# Patient Record
Sex: Female | Born: 1955 | Race: White | Hispanic: No | Smoking: Former smoker
Health system: Southern US, Community
[De-identification: ages and names within clinical notes are randomized; demographics above are authoritative.]

## PROBLEM LIST (undated history)

## (undated) DIAGNOSIS — Q250 Patent ductus arteriosus: Secondary | ICD-10-CM

## (undated) DIAGNOSIS — F32A Depression, unspecified: Secondary | ICD-10-CM

## (undated) DIAGNOSIS — G4730 Sleep apnea, unspecified: Secondary | ICD-10-CM

## (undated) DIAGNOSIS — C801 Malignant (primary) neoplasm, unspecified: Secondary | ICD-10-CM

## (undated) DIAGNOSIS — I10 Essential (primary) hypertension: Secondary | ICD-10-CM

## (undated) DIAGNOSIS — J189 Pneumonia, unspecified organism: Secondary | ICD-10-CM

## (undated) DIAGNOSIS — M797 Fibromyalgia: Secondary | ICD-10-CM

## (undated) DIAGNOSIS — M1990 Unspecified osteoarthritis, unspecified site: Secondary | ICD-10-CM

## (undated) HISTORY — PX: WISDOM TOOTH EXTRACTION: SHX21

## (undated) HISTORY — PX: COLONOSCOPY: SHX174

## (undated) HISTORY — PX: OTHER SURGICAL HISTORY: SHX169

## (undated) HISTORY — PX: DILATION AND CURETTAGE OF UTERUS: SHX78

---

## 2004-06-26 ENCOUNTER — Ambulatory Visit: Payer: Self-pay | Admitting: Internal Medicine

## 2004-10-07 ENCOUNTER — Ambulatory Visit: Payer: Self-pay | Admitting: Gastroenterology

## 2005-10-08 ENCOUNTER — Ambulatory Visit: Payer: Self-pay | Admitting: Internal Medicine

## 2006-12-09 ENCOUNTER — Ambulatory Visit: Payer: Self-pay | Admitting: Internal Medicine

## 2007-06-07 ENCOUNTER — Ambulatory Visit: Payer: Self-pay | Admitting: Gastroenterology

## 2008-12-26 ENCOUNTER — Ambulatory Visit: Payer: Self-pay | Admitting: Family Medicine

## 2010-01-06 ENCOUNTER — Ambulatory Visit: Payer: Self-pay | Admitting: Family Medicine

## 2011-01-28 ENCOUNTER — Ambulatory Visit: Payer: Self-pay | Admitting: Family Medicine

## 2013-08-14 ENCOUNTER — Ambulatory Visit: Payer: Self-pay | Admitting: Family Medicine

## 2014-03-12 ENCOUNTER — Emergency Department: Payer: Self-pay | Admitting: Emergency Medicine

## 2014-08-27 ENCOUNTER — Emergency Department: Payer: Self-pay | Admitting: Emergency Medicine

## 2014-08-27 LAB — COMPREHENSIVE METABOLIC PANEL
Albumin: 3.6 g/dL (ref 3.4–5.0)
Alkaline Phosphatase: 79 U/L
Anion Gap: 7 (ref 7–16)
BUN: 17 mg/dL (ref 7–18)
Bilirubin,Total: 0.3 mg/dL (ref 0.2–1.0)
CALCIUM: 8.6 mg/dL (ref 8.5–10.1)
CHLORIDE: 107 mmol/L (ref 98–107)
CREATININE: 1.21 mg/dL (ref 0.60–1.30)
Co2: 27 mmol/L (ref 21–32)
EGFR (African American): 58 — ABNORMAL LOW
EGFR (Non-African Amer.): 48 — ABNORMAL LOW
Glucose: 83 mg/dL (ref 65–99)
Osmolality: 282 (ref 275–301)
Potassium: 3.4 mmol/L — ABNORMAL LOW (ref 3.5–5.1)
SGOT(AST): 27 U/L (ref 15–37)
SGPT (ALT): 26 U/L
Sodium: 141 mmol/L (ref 136–145)
Total Protein: 7.3 g/dL (ref 6.4–8.2)

## 2014-08-27 LAB — CBC
HCT: 41.3 % (ref 35.0–47.0)
HGB: 13.8 g/dL (ref 12.0–16.0)
MCH: 29.3 pg (ref 26.0–34.0)
MCHC: 33.4 g/dL (ref 32.0–36.0)
MCV: 88 fL (ref 80–100)
PLATELETS: 243 10*3/uL (ref 150–440)
RBC: 4.72 10*6/uL (ref 3.80–5.20)
RDW: 13 % (ref 11.5–14.5)
WBC: 7.5 10*3/uL (ref 3.6–11.0)

## 2014-08-27 LAB — URINALYSIS, COMPLETE
Bilirubin,UR: NEGATIVE
Blood: NEGATIVE
Glucose,UR: NEGATIVE mg/dL (ref 0–75)
Hyaline Cast: 8
KETONE: NEGATIVE
Nitrite: NEGATIVE
PH: 5 (ref 4.5–8.0)
Protein: 100
RBC,UR: 11 /HPF (ref 0–5)
Specific Gravity: 1.029 (ref 1.003–1.030)
Squamous Epithelial: 11
WBC UR: 15 /HPF (ref 0–5)

## 2014-08-27 LAB — TROPONIN I: Troponin-I: 0.02 ng/mL

## 2015-06-21 ENCOUNTER — Other Ambulatory Visit: Payer: Self-pay | Admitting: Family Medicine

## 2015-06-21 DIAGNOSIS — Z1231 Encounter for screening mammogram for malignant neoplasm of breast: Secondary | ICD-10-CM

## 2015-07-01 ENCOUNTER — Ambulatory Visit: Payer: Self-pay | Attending: Family Medicine

## 2015-11-27 ENCOUNTER — Ambulatory Visit: Payer: Self-pay

## 2015-12-04 ENCOUNTER — Ambulatory Visit
Admission: RE | Admit: 2015-12-04 | Discharge: 2015-12-04 | Disposition: A | Discharge status: Home / Self Care | Payer: 59 | Source: Ambulatory Visit | Attending: Family Medicine | Admitting: Family Medicine

## 2015-12-04 DIAGNOSIS — Z1231 Encounter for screening mammogram for malignant neoplasm of breast: Secondary | ICD-10-CM | POA: Insufficient documentation

## 2017-02-16 ENCOUNTER — Other Ambulatory Visit: Payer: Self-pay | Admitting: Family Medicine

## 2017-02-16 DIAGNOSIS — Z1231 Encounter for screening mammogram for malignant neoplasm of breast: Secondary | ICD-10-CM

## 2017-03-04 ENCOUNTER — Ambulatory Visit
Admission: RE | Admit: 2017-03-04 | Discharge: 2017-03-04 | Disposition: A | Discharge status: Home / Self Care | Payer: 59 | Source: Ambulatory Visit | Attending: Family Medicine | Admitting: Family Medicine

## 2017-03-04 DIAGNOSIS — Z1231 Encounter for screening mammogram for malignant neoplasm of breast: Secondary | ICD-10-CM | POA: Diagnosis not present

## 2018-06-13 ENCOUNTER — Other Ambulatory Visit: Payer: Self-pay | Admitting: Family Medicine

## 2018-06-13 DIAGNOSIS — Z1231 Encounter for screening mammogram for malignant neoplasm of breast: Secondary | ICD-10-CM

## 2018-06-15 ENCOUNTER — Ambulatory Visit
Admission: RE | Admit: 2018-06-15 | Discharge: 2018-06-15 | Disposition: A | Discharge status: Home / Self Care | Payer: BLUE CROSS/BLUE SHIELD | Source: Ambulatory Visit | Attending: Family Medicine | Admitting: Family Medicine

## 2018-06-15 DIAGNOSIS — Z1231 Encounter for screening mammogram for malignant neoplasm of breast: Secondary | ICD-10-CM | POA: Diagnosis not present

## 2019-07-28 ENCOUNTER — Other Ambulatory Visit: Payer: Self-pay

## 2019-07-28 DIAGNOSIS — Z20822 Contact with and (suspected) exposure to covid-19: Secondary | ICD-10-CM

## 2019-07-31 LAB — NOVEL CORONAVIRUS, NAA: SARS-CoV-2, NAA: NOT DETECTED

## 2019-12-13 ENCOUNTER — Other Ambulatory Visit: Payer: Self-pay | Admitting: Family Medicine

## 2019-12-13 DIAGNOSIS — Z780 Asymptomatic menopausal state: Secondary | ICD-10-CM

## 2019-12-13 DIAGNOSIS — Z1231 Encounter for screening mammogram for malignant neoplasm of breast: Secondary | ICD-10-CM

## 2020-01-18 ENCOUNTER — Other Ambulatory Visit: Payer: Self-pay | Admitting: Student

## 2020-01-18 ENCOUNTER — Ambulatory Visit
Admission: RE | Admit: 2020-01-18 | Discharge: 2020-01-18 | Disposition: A | Discharge status: Home / Self Care | Payer: Medicare Other | Source: Ambulatory Visit | Attending: Student | Admitting: Student

## 2020-01-18 ENCOUNTER — Other Ambulatory Visit: Payer: Self-pay

## 2020-01-18 DIAGNOSIS — M79601 Pain in right arm: Secondary | ICD-10-CM | POA: Insufficient documentation

## 2020-01-30 ENCOUNTER — Ambulatory Visit
Admission: RE | Admit: 2020-01-30 | Discharge: 2020-01-30 | Disposition: A | Discharge status: Home / Self Care | Payer: Medicare Other | Source: Ambulatory Visit | Attending: Family Medicine | Admitting: Family Medicine

## 2020-01-30 DIAGNOSIS — Z780 Asymptomatic menopausal state: Secondary | ICD-10-CM | POA: Diagnosis present

## 2020-01-30 DIAGNOSIS — Z1231 Encounter for screening mammogram for malignant neoplasm of breast: Secondary | ICD-10-CM | POA: Insufficient documentation

## 2020-12-25 ENCOUNTER — Other Ambulatory Visit: Payer: Self-pay | Admitting: Orthopedic Surgery

## 2020-12-25 DIAGNOSIS — M2392 Unspecified internal derangement of left knee: Secondary | ICD-10-CM

## 2020-12-25 DIAGNOSIS — M1712 Unilateral primary osteoarthritis, left knee: Secondary | ICD-10-CM

## 2020-12-25 DIAGNOSIS — G8929 Other chronic pain: Secondary | ICD-10-CM

## 2021-01-06 ENCOUNTER — Ambulatory Visit
Admission: RE | Admit: 2021-01-06 | Discharge: 2021-01-06 | Disposition: A | Discharge status: Home / Self Care | Payer: Medicare Other | Source: Ambulatory Visit | Attending: Orthopedic Surgery | Admitting: Orthopedic Surgery

## 2021-01-06 ENCOUNTER — Other Ambulatory Visit: Payer: Self-pay

## 2021-01-06 DIAGNOSIS — M1712 Unilateral primary osteoarthritis, left knee: Secondary | ICD-10-CM | POA: Diagnosis present

## 2021-01-06 DIAGNOSIS — G8929 Other chronic pain: Secondary | ICD-10-CM | POA: Diagnosis present

## 2021-01-06 DIAGNOSIS — M2392 Unspecified internal derangement of left knee: Secondary | ICD-10-CM | POA: Diagnosis present

## 2021-01-06 DIAGNOSIS — M25562 Pain in left knee: Secondary | ICD-10-CM | POA: Insufficient documentation

## 2021-03-19 ENCOUNTER — Other Ambulatory Visit: Payer: Self-pay | Admitting: Family Medicine

## 2021-03-19 DIAGNOSIS — Z1231 Encounter for screening mammogram for malignant neoplasm of breast: Secondary | ICD-10-CM

## 2021-03-25 ENCOUNTER — Other Ambulatory Visit: Payer: Self-pay | Admitting: Surgery

## 2021-04-08 ENCOUNTER — Other Ambulatory Visit: Payer: Self-pay

## 2021-04-08 ENCOUNTER — Encounter
Admission: RE | Admit: 2021-04-08 | Discharge: 2021-04-08 | Disposition: A | Discharge status: Home / Self Care | Payer: Medicare Other | Source: Ambulatory Visit | Attending: Surgery | Admitting: Surgery

## 2021-04-08 DIAGNOSIS — Z01818 Encounter for other preprocedural examination: Secondary | ICD-10-CM | POA: Insufficient documentation

## 2021-04-08 DIAGNOSIS — I10 Essential (primary) hypertension: Secondary | ICD-10-CM | POA: Insufficient documentation

## 2021-04-08 HISTORY — DX: Unspecified osteoarthritis, unspecified site: M19.90

## 2021-04-08 HISTORY — DX: Depression, unspecified: F32.A

## 2021-04-08 HISTORY — DX: Patent ductus arteriosus: Q25.0

## 2021-04-08 HISTORY — DX: Fibromyalgia: M79.7

## 2021-04-08 HISTORY — DX: Pneumonia, unspecified organism: J18.9

## 2021-04-08 HISTORY — DX: Malignant (primary) neoplasm, unspecified: C80.1

## 2021-04-08 HISTORY — DX: Sleep apnea, unspecified: G47.30

## 2021-04-08 HISTORY — DX: Essential (primary) hypertension: I10

## 2021-04-08 LAB — URINALYSIS, ROUTINE W REFLEX MICROSCOPIC
Bilirubin Urine: NEGATIVE
Glucose, UA: NEGATIVE mg/dL
Hgb urine dipstick: NEGATIVE
Ketones, ur: NEGATIVE mg/dL
Leukocytes,Ua: NEGATIVE
Nitrite: NEGATIVE
Protein, ur: NEGATIVE mg/dL
Specific Gravity, Urine: 1.016 (ref 1.005–1.030)
pH: 6 (ref 5.0–8.0)

## 2021-04-08 LAB — CBC WITH DIFFERENTIAL/PLATELET
Abs Immature Granulocytes: 0.03 10*3/uL (ref 0.00–0.07)
Basophils Absolute: 0 10*3/uL (ref 0.0–0.1)
Basophils Relative: 0 %
Eosinophils Absolute: 0.3 10*3/uL (ref 0.0–0.5)
Eosinophils Relative: 4 %
HCT: 42.4 % (ref 36.0–46.0)
Hemoglobin: 14.1 g/dL (ref 12.0–15.0)
Immature Granulocytes: 0 %
Lymphocytes Relative: 31 %
Lymphs Abs: 2.1 10*3/uL (ref 0.7–4.0)
MCH: 29 pg (ref 26.0–34.0)
MCHC: 33.3 g/dL (ref 30.0–36.0)
MCV: 87.2 fL (ref 80.0–100.0)
Monocytes Absolute: 0.6 10*3/uL (ref 0.1–1.0)
Monocytes Relative: 9 %
Neutro Abs: 3.7 10*3/uL (ref 1.7–7.7)
Neutrophils Relative %: 56 %
Platelets: 269 10*3/uL (ref 150–400)
RBC: 4.86 MIL/uL (ref 3.87–5.11)
RDW: 12.3 % (ref 11.5–15.5)
WBC: 6.7 10*3/uL (ref 4.0–10.5)
nRBC: 0 % (ref 0.0–0.2)

## 2021-04-08 LAB — COMPREHENSIVE METABOLIC PANEL
ALT: 17 U/L (ref 0–44)
AST: 17 U/L (ref 15–41)
Albumin: 4 g/dL (ref 3.5–5.0)
Alkaline Phosphatase: 72 U/L (ref 38–126)
Anion gap: 6 (ref 5–15)
BUN: 16 mg/dL (ref 8–23)
CO2: 27 mmol/L (ref 22–32)
Calcium: 9.3 mg/dL (ref 8.9–10.3)
Chloride: 106 mmol/L (ref 98–111)
Creatinine, Ser: 0.95 mg/dL (ref 0.44–1.00)
GFR, Estimated: >60 mL/min (ref >60)
Glucose, Bld: 88 mg/dL (ref 70–99)
Potassium: 3.8 mmol/L (ref 3.5–5.1)
Sodium: 139 mmol/L (ref 135–145)
Total Bilirubin: 0.7 mg/dL (ref 0.3–1.2)
Total Protein: 7.1 g/dL (ref 6.5–8.1)

## 2021-04-08 LAB — TYPE AND SCREEN
ABO/RH(D): A POS
Antibody Screen: NEGATIVE

## 2021-04-08 LAB — SURGICAL PCR SCREEN
MRSA, PCR: NEGATIVE
Staphylococcus aureus: NEGATIVE

## 2021-04-08 NOTE — Patient Instructions (Addendum)
Your procedure is scheduled on: 04/15/21 - Tuesday Report to the Registration Desk on the 1st floor of the [facility]. To find out your arrival time, please call [phone] between 1PM - 3PM on: 04/14/21 - Monday Report to [facility] for Covid Test on 04/11/21 - 8:25 am   REMEMBER: Instructions that are not followed completely may result in serious medical risk, up to and including death; or upon the discretion of your surgeon and anesthesiologist your surgery may need to be rescheduled.  Do not eat food after midnight the night before surgery.  No gum chewing, lozengers or hard candies.  You may however, drink CLEAR liquids up to 2 hours before you are scheduled to arrive for your surgery. Do not drink anything within 2 hours of your scheduled arrival time.  Clear liquids include: - water  - apple juice without pulp - gatorade (not RED, PURPLE, OR BLUE) - black coffee or tea (Do NOT add milk or creamers to the coffee or tea) Do NOT drink anything that is not on this list.  Type 1 and Type 2 diabetics should only drink water.  In addition, your doctor has ordered for you to drink the provided  Ensure Pre-Surgery Clear Carbohydrate Drink  Drinking this carbohydrate drink up to two hours before surgery helps to reduce insulin resistance and improve patient outcomes. Please complete drinking 2 hours prior to scheduled arrival time.  TAKE THESE MEDICATIONS THE MORNING OF SURGERY WITH A SIP OF WATER:  - buPROPion (WELLBUTRIN XL) 150 MG 24 hr tablet - celecoxib (CELEBREX) 200 MG capsule - oxybutynin (DITROPAN) 5 MG tablet  One week prior to surgery: Stop Anti-inflammatories (NSAIDS) such as Advil, Aleve, Ibuprofen, Motrin, Naproxen, Naprosyn and Aspirin based products such as Excedrin, Goodys Powder, BC Powder.  Stop ANY OVER THE COUNTER supplements until after surgery.  You may however, continue to take Tylenol if needed for pain up until the day of surgery.  No Alcohol for  24 hours before or after surgery.  No Smoking including e-cigarettes for 24 hours prior to surgery.  No chewable tobacco products for at least 6 hours prior to surgery.  No nicotine patches on the day of surgery.  Do not use any "recreational" drugs for at least a week prior to your surgery.  Please be advised that the combination of cocaine and anesthesia may have negative outcomes, up to and including death. If you test positive for cocaine, your surgery will be cancelled.  On the morning of surgery brush your teeth with toothpaste and water, you may rinse your mouth with mouthwash if you wish. Do not swallow any toothpaste or mouthwash.  Do not wear jewelry, make-up, hairpins, clips or nail polish.  Do not wear lotions, powders, or perfumes.   Do not shave body from the neck down 48 hours prior to surgery just in case you cut yourself which could leave a site for infection.  Also, freshly shaved skin may become irritated if using the CHG soap.  Contact lenses, hearing aids and dentures may not be worn into surgery.  Do not bring valuables to the hospital. [facility] is not responsible for any missing/lost belongings or valuables.   Use CHG Soap or wipes as directed on instruction sheet.  Notify your doctor if there is any change in your medical condition (cold, fever, infection).  Wear comfortable clothing (specific to your surgery type) to the hospital.  After surgery, you can help prevent lung complications by doing breathing exercises.  Take deep breaths and cough every 1-2 hours. Your doctor may order a device called an Incentive Spirometer to help you take deep breaths. When coughing or sneezing, hold a pillow firmly against your incision with both hands. This is called "splinting." Doing this helps protect your incision. It also decreases belly discomfort.  If you are being admitted to the hospital overnight, leave your suitcase in the car. After surgery it may be brought  to your room.  If you are being discharged the day of surgery, you will not be allowed to drive home. You will need a responsible adult (18 years or older) to drive you home and stay with you that night.   If you are taking public transportation, you will need to have a responsible adult (18 years or older) with you. Please confirm with your physician that it is acceptable to use public transportation.   Please call the [facility] at [phone] if you have any questions about these instructions.  Surgery Visitation Policy:  Patients undergoing a surgery or procedure may have one family member or support person with them as long as that person is not COVID-19 positive or experiencing its symptoms.  That person may remain in the waiting area during the procedure.  Inpatient Visitation:    Visiting hours are 7 a.m. to 8 p.m. Inpatients will be allowed two visitors daily. The visitors may change each day during the patient's stay. No visitors under the age of 12. Any visitor under the age of 35 must be accompanied by an adult. The visitor must pass COVID-19 screenings, use hand sanitizer when entering and exiting the patient's room and wear a mask at all times, including in the patient's room. Patients must also wear a mask when staff or their visitor are in the room. Masking is required regardless of vaccination status.

## 2021-04-10 ENCOUNTER — Ambulatory Visit
Admission: RE | Admit: 2021-04-10 | Discharge: 2021-04-10 | Disposition: A | Discharge status: Home / Self Care | Payer: Medicare Other | Source: Ambulatory Visit | Attending: Family Medicine | Admitting: Family Medicine

## 2021-04-10 ENCOUNTER — Other Ambulatory Visit: Payer: Self-pay

## 2021-04-10 DIAGNOSIS — Z1231 Encounter for screening mammogram for malignant neoplasm of breast: Secondary | ICD-10-CM | POA: Insufficient documentation

## 2021-04-11 ENCOUNTER — Other Ambulatory Visit
Admission: RE | Admit: 2021-04-11 | Discharge: 2021-04-11 | Disposition: A | Discharge status: Home / Self Care | Payer: Medicare Other | Source: Ambulatory Visit | Attending: Surgery | Admitting: Surgery

## 2021-04-11 DIAGNOSIS — Z01812 Encounter for preprocedural laboratory examination: Secondary | ICD-10-CM | POA: Diagnosis present

## 2021-04-11 DIAGNOSIS — Z20822 Contact with and (suspected) exposure to covid-19: Secondary | ICD-10-CM | POA: Diagnosis not present

## 2021-04-11 LAB — SARS CORONAVIRUS 2 (TAT 6-24 HRS): SARS Coronavirus 2: NEGATIVE

## 2021-04-15 ENCOUNTER — Encounter: Payer: Self-pay | Admitting: Surgery

## 2021-04-15 ENCOUNTER — Inpatient Hospital Stay: Payer: Medicare Other

## 2021-04-15 ENCOUNTER — Inpatient Hospital Stay: Payer: Medicare Other | Admitting: Certified Registered"

## 2021-04-15 ENCOUNTER — Observation Stay
Admission: RE | Admit: 2021-04-15 | Discharge: 2021-04-16 | Disposition: A | Discharge status: Home / Self Care | Payer: Medicare Other | Attending: Surgery | Admitting: Surgery

## 2021-04-15 ENCOUNTER — Encounter
Admission: RE | Disposition: A | Discharge status: Home / Self Care | Payer: Self-pay | Source: Home / Self Care | Attending: Surgery

## 2021-04-15 ENCOUNTER — Other Ambulatory Visit: Payer: Self-pay

## 2021-04-15 DIAGNOSIS — Z859 Personal history of malignant neoplasm, unspecified: Secondary | ICD-10-CM | POA: Insufficient documentation

## 2021-04-15 DIAGNOSIS — M1712 Unilateral primary osteoarthritis, left knee: Principal | ICD-10-CM | POA: Insufficient documentation

## 2021-04-15 DIAGNOSIS — Z96652 Presence of left artificial knee joint: Secondary | ICD-10-CM

## 2021-04-15 DIAGNOSIS — Z87891 Personal history of nicotine dependence: Secondary | ICD-10-CM | POA: Insufficient documentation

## 2021-04-15 DIAGNOSIS — Z79899 Other long term (current) drug therapy: Secondary | ICD-10-CM | POA: Insufficient documentation

## 2021-04-15 DIAGNOSIS — I10 Essential (primary) hypertension: Secondary | ICD-10-CM | POA: Diagnosis not present

## 2021-04-15 HISTORY — PX: TOTAL KNEE ARTHROPLASTY: SHX125

## 2021-04-15 LAB — ABO/RH: ABO/RH(D): A POS

## 2021-04-15 SURGERY — ARTHROPLASTY, KNEE, TOTAL
Anesthesia: General | Site: Knee | Laterality: Left

## 2021-04-15 MED ORDER — BUPIVACAINE LIPOSOME 1.3 % IJ SUSP
INTRAMUSCULAR | Status: AC
  Filled 2021-04-15: qty 20

## 2021-04-15 MED ORDER — FLEET ENEMA 7-19 GM/118ML RE ENEM: 1.0000 | ENEMA | Freq: Once | RECTAL | Status: DC | PRN

## 2021-04-15 MED ORDER — BUPIVACAINE-EPINEPHRINE (PF) 0.5% -1:200000 IJ SOLN
INTRAMUSCULAR | Status: DC | PRN
  Administered 2021-04-15: 30 mL

## 2021-04-15 MED ORDER — FENTANYL CITRATE (PF) 100 MCG/2ML IJ SOLN
25.0000 ug | INTRAMUSCULAR | Status: DC | PRN
  Administered 2021-04-15 (×2): 25 ug via INTRAVENOUS

## 2021-04-15 MED ORDER — FAMOTIDINE 20 MG PO TABS: 20.0000 mg | ORAL_TABLET | Freq: Once | ORAL | Status: AC

## 2021-04-15 MED ORDER — CHLORHEXIDINE GLUCONATE 0.12 % MT SOLN
OROMUCOSAL | Status: AC
  Administered 2021-04-15: 15 mL via OROMUCOSAL
  Filled 2021-04-15: qty 15

## 2021-04-15 MED ORDER — KETOROLAC TROMETHAMINE 15 MG/ML IJ SOLN
7.5000 mg | Freq: Four times a day (QID) | INTRAMUSCULAR | Status: AC
  Administered 2021-04-15 – 2021-04-16 (×4): 7.5 mg via INTRAVENOUS
  Filled 2021-04-15 (×4): qty 1

## 2021-04-15 MED ORDER — GLYCOPYRROLATE 0.2 MG/ML IJ SOLN
INTRAMUSCULAR | Status: DC | PRN
  Administered 2021-04-15: .2 mg via INTRAVENOUS

## 2021-04-15 MED ORDER — ACETAMINOPHEN 10 MG/ML IV SOLN
INTRAVENOUS | Status: AC
  Filled 2021-04-15: qty 100

## 2021-04-15 MED ORDER — PROPOFOL 500 MG/50ML IV EMUL
INTRAVENOUS | Status: DC | PRN
  Administered 2021-04-15: 100 ug/kg/min via INTRAVENOUS

## 2021-04-15 MED ORDER — LACTATED RINGERS IV SOLN: INTRAVENOUS | Status: DC

## 2021-04-15 MED ORDER — 0.9 % SODIUM CHLORIDE (POUR BTL) OPTIME
TOPICAL | Status: DC | PRN
  Administered 2021-04-15: 1000 mL

## 2021-04-15 MED ORDER — CEFAZOLIN SODIUM-DEXTROSE 2-4 GM/100ML-% IV SOLN
2.0000 g | Freq: Four times a day (QID) | INTRAVENOUS | Status: AC
  Administered 2021-04-15 – 2021-04-16 (×3): 2 g via INTRAVENOUS
  Filled 2021-04-15 (×3): qty 100

## 2021-04-15 MED ORDER — METOCLOPRAMIDE HCL 10 MG PO TABS: 5.0000 mg | ORAL_TABLET | Freq: Three times a day (TID) | ORAL | Status: DC | PRN

## 2021-04-15 MED ORDER — ONDANSETRON HCL 4 MG PO TABS: 4.0000 mg | ORAL_TABLET | Freq: Four times a day (QID) | ORAL | Status: DC | PRN

## 2021-04-15 MED ORDER — DOCUSATE SODIUM 100 MG PO CAPS
100.0000 mg | ORAL_CAPSULE | Freq: Two times a day (BID) | ORAL | Status: DC
  Administered 2021-04-15 – 2021-04-16 (×2): 100 mg via ORAL
  Filled 2021-04-15 (×2): qty 1

## 2021-04-15 MED ORDER — FENTANYL CITRATE (PF) 100 MCG/2ML IJ SOLN
INTRAMUSCULAR | Status: AC
  Filled 2021-04-15: qty 2

## 2021-04-15 MED ORDER — BUPIVACAINE-EPINEPHRINE (PF) 0.5% -1:200000 IJ SOLN
INTRAMUSCULAR | Status: AC
  Filled 2021-04-15: qty 30

## 2021-04-15 MED ORDER — TRAZODONE HCL 100 MG PO TABS
100.0000 mg | ORAL_TABLET | Freq: Every day | ORAL | Status: DC
  Administered 2021-04-15: 100 mg via ORAL
  Filled 2021-04-15: qty 1

## 2021-04-15 MED ORDER — FENTANYL CITRATE (PF) 100 MCG/2ML IJ SOLN
INTRAMUSCULAR | Status: DC | PRN
  Administered 2021-04-15: 50 ug via INTRAVENOUS
  Administered 2021-04-15: 25 ug via INTRAVENOUS

## 2021-04-15 MED ORDER — BUPIVACAINE HCL (PF) 0.5 % IJ SOLN
INTRAMUSCULAR | Status: DC | PRN
  Administered 2021-04-15: 3 mL

## 2021-04-15 MED ORDER — TRAMADOL HCL 50 MG PO TABS
50.0000 mg | ORAL_TABLET | Freq: Four times a day (QID) | ORAL | Status: DC | PRN
Start: 2021-04-15 — End: 2021-04-16

## 2021-04-15 MED ORDER — ACETAMINOPHEN 10 MG/ML IV SOLN
INTRAVENOUS | Status: DC | PRN
  Administered 2021-04-15: 1000 mg via INTRAVENOUS

## 2021-04-15 MED ORDER — BUPIVACAINE LIPOSOME 1.3 % IJ SUSP
INTRAMUSCULAR | Status: DC | PRN
  Administered 2021-04-15: 20 mL

## 2021-04-15 MED ORDER — AMLODIPINE BESYLATE 10 MG PO TABS
10.0000 mg | ORAL_TABLET | Freq: Every day | ORAL | Status: DC
  Administered 2021-04-15: 10 mg via ORAL
  Filled 2021-04-15: qty 1

## 2021-04-15 MED ORDER — ORAL CARE MOUTH RINSE
15.0000 mL | Freq: Once | OROMUCOSAL | Status: AC
Start: 2021-04-15 — End: 2021-04-15

## 2021-04-15 MED ORDER — ACETAMINOPHEN 500 MG PO TABS
1000.0000 mg | ORAL_TABLET | Freq: Four times a day (QID) | ORAL | Status: DC
  Administered 2021-04-15 – 2021-04-16 (×3): 1000 mg via ORAL
  Filled 2021-04-15 (×4): qty 2

## 2021-04-15 MED ORDER — MIDAZOLAM HCL 2 MG/2ML IJ SOLN
INTRAMUSCULAR | Status: AC
  Filled 2021-04-15: qty 2

## 2021-04-15 MED ORDER — ACETAMINOPHEN 325 MG PO TABS: 325.0000 mg | ORAL_TABLET | Freq: Four times a day (QID) | ORAL | Status: DC | PRN

## 2021-04-15 MED ORDER — SODIUM CHLORIDE (PF) 0.9 % IJ SOLN
INTRAMUSCULAR | Status: DC | PRN
  Administered 2021-04-15: 40 mL

## 2021-04-15 MED ORDER — FAMOTIDINE 20 MG PO TABS
ORAL_TABLET | ORAL | Status: AC
  Administered 2021-04-15: 20 mg via ORAL
  Filled 2021-04-15: qty 1

## 2021-04-15 MED ORDER — OXYBUTYNIN CHLORIDE 5 MG PO TABS
5.0000 mg | ORAL_TABLET | Freq: Every day | ORAL | Status: DC
  Administered 2021-04-16: 5 mg via ORAL
  Filled 2021-04-15: qty 1

## 2021-04-15 MED ORDER — MIDAZOLAM HCL 5 MG/5ML IJ SOLN
INTRAMUSCULAR | Status: DC | PRN
  Administered 2021-04-15: .5 mg via INTRAVENOUS
  Administered 2021-04-15: 1.5 mg via INTRAVENOUS

## 2021-04-15 MED ORDER — BISACODYL 10 MG RE SUPP: 10.0000 mg | Freq: Every day | RECTAL | Status: DC | PRN

## 2021-04-15 MED ORDER — HYDROMORPHONE HCL 1 MG/ML IJ SOLN
0.2500 mg | INTRAMUSCULAR | Status: DC | PRN
  Administered 2021-04-15: 0.5 mg via INTRAVENOUS
  Filled 2021-04-15: qty 1

## 2021-04-15 MED ORDER — CEFAZOLIN SODIUM-DEXTROSE 2-4 GM/100ML-% IV SOLN
2.0000 g | INTRAVENOUS | Status: AC
  Administered 2021-04-15: 2 g via INTRAVENOUS

## 2021-04-15 MED ORDER — CHLORHEXIDINE GLUCONATE 0.12 % MT SOLN: 15.0000 mL | Freq: Once | OROMUCOSAL | Status: AC

## 2021-04-15 MED ORDER — SODIUM CHLORIDE FLUSH 0.9 % IV SOLN
INTRAVENOUS | Status: AC
  Filled 2021-04-15: qty 40

## 2021-04-15 MED ORDER — CEFAZOLIN SODIUM-DEXTROSE 2-4 GM/100ML-% IV SOLN
INTRAVENOUS | Status: AC
  Filled 2021-04-15: qty 100

## 2021-04-15 MED ORDER — METOCLOPRAMIDE HCL 5 MG/ML IJ SOLN: 5.0000 mg | Freq: Three times a day (TID) | INTRAMUSCULAR | Status: DC | PRN

## 2021-04-15 MED ORDER — TRANEXAMIC ACID 1000 MG/10ML IV SOLN
INTRAVENOUS | Status: AC
  Filled 2021-04-15: qty 10

## 2021-04-15 MED ORDER — SODIUM CHLORIDE 0.9 % IV SOLN
INTRAVENOUS | Status: DC | PRN
  Administered 2021-04-15: 10 ug/min via INTRAVENOUS

## 2021-04-15 MED ORDER — ONDANSETRON HCL 4 MG/2ML IJ SOLN: 4.0000 mg | Freq: Four times a day (QID) | INTRAMUSCULAR | Status: DC | PRN

## 2021-04-15 MED ORDER — OXYCODONE HCL 5 MG PO TABS
5.0000 mg | ORAL_TABLET | ORAL | Status: DC | PRN
  Administered 2021-04-15 – 2021-04-16 (×3): 5 mg via ORAL
  Administered 2021-04-16: 10 mg via ORAL
  Administered 2021-04-16: 5 mg via ORAL
  Filled 2021-04-15 (×2): qty 1
  Filled 2021-04-15: qty 2
  Filled 2021-04-15: qty 1

## 2021-04-15 MED ORDER — MAGNESIUM HYDROXIDE 400 MG/5ML PO SUSP: 30.0000 mL | Freq: Every day | ORAL | Status: DC | PRN

## 2021-04-15 MED ORDER — ONDANSETRON HCL 4 MG/2ML IJ SOLN: 4.0000 mg | Freq: Once | INTRAMUSCULAR | Status: DC | PRN

## 2021-04-15 MED ORDER — ENALAPRIL MALEATE 5 MG PO TABS
5.0000 mg | ORAL_TABLET | Freq: Every day | ORAL | Status: DC
  Administered 2021-04-15: 5 mg via ORAL
  Filled 2021-04-15 (×2): qty 1

## 2021-04-15 MED ORDER — APIXABAN 2.5 MG PO TABS
2.5000 mg | ORAL_TABLET | Freq: Two times a day (BID) | ORAL | Status: DC
  Administered 2021-04-16: 2.5 mg via ORAL
  Filled 2021-04-15: qty 1

## 2021-04-15 MED ORDER — DEXAMETHASONE SODIUM PHOSPHATE 10 MG/ML IJ SOLN
INTRAMUSCULAR | Status: DC | PRN
  Administered 2021-04-15: 10 mg via INTRAVENOUS

## 2021-04-15 MED ORDER — EPHEDRINE SULFATE 50 MG/ML IJ SOLN
INTRAMUSCULAR | Status: DC | PRN
  Administered 2021-04-15: 5 mg via INTRAVENOUS

## 2021-04-15 MED ORDER — PRAVASTATIN SODIUM 20 MG PO TABS
10.0000 mg | ORAL_TABLET | Freq: Every day | ORAL | Status: DC
  Administered 2021-04-15: 10 mg via ORAL
  Filled 2021-04-15: qty 1

## 2021-04-15 MED ORDER — BUPROPION HCL ER (XL) 150 MG PO TB24
300.0000 mg | ORAL_TABLET | Freq: Every day | ORAL | Status: DC
  Administered 2021-04-16: 300 mg via ORAL
  Filled 2021-04-15: qty 2

## 2021-04-15 MED ORDER — SODIUM CHLORIDE 0.9 % IV SOLN: INTRAVENOUS | Status: DC

## 2021-04-15 MED ORDER — DIPHENHYDRAMINE HCL 12.5 MG/5ML PO ELIX: 12.5000 mg | ORAL_SOLUTION | ORAL | Status: DC | PRN

## 2021-04-15 MED ORDER — TRANEXAMIC ACID 1000 MG/10ML IV SOLN
INTRAVENOUS | Status: DC | PRN
  Administered 2021-04-15: 1000 mg via TOPICAL

## 2021-04-15 MED ORDER — OXYCODONE HCL 5 MG PO TABS
ORAL_TABLET | ORAL | Status: AC
  Filled 2021-04-15: qty 1

## 2021-04-15 MED ORDER — FLUTICASONE PROPIONATE 50 MCG/ACT NA SUSP
1.0000 | Freq: Every day | NASAL | Status: DC | PRN
  Filled 2021-04-15: qty 16

## 2021-04-15 MED ORDER — SODIUM CHLORIDE 0.9 % IR SOLN
Status: DC | PRN
  Administered 2021-04-15: 3000 mL

## 2021-04-15 MED ORDER — ONDANSETRON HCL 4 MG/2ML IJ SOLN
INTRAMUSCULAR | Status: DC | PRN
  Administered 2021-04-15: 4 mg via INTRAVENOUS

## 2021-04-15 SURGICAL SUPPLY — 60 items
BIT DRILL QUICK REL 1/8 2PK SL (DRILL) ×1 IMPLANT
BLADE SAW SAG 25X90X1.19 (BLADE) ×2 IMPLANT
BLADE SURG SZ20 CARB STEEL (BLADE) ×2 IMPLANT
BNDG ELASTIC 6X5.8 VLCR NS LF (GAUZE/BANDAGES/DRESSINGS) ×2 IMPLANT
CEMENT BONE R 1X40 (Cement) ×4 IMPLANT
CEMENT VACUUM MIXING SYSTEM (MISCELLANEOUS) ×2 IMPLANT
CHLORAPREP W/TINT 26 (MISCELLANEOUS) ×4 IMPLANT
COMPONENT PATELLAR VGD 7.8X3 (Joint) ×2 IMPLANT
COOLER POLAR GLACIER W/PUMP (MISCELLANEOUS) ×2 IMPLANT
COVER MAYO STAND REUSABLE (DRAPES) ×2 IMPLANT
CUFF TOURN SGL QUICK 24 (TOURNIQUET CUFF)
CUFF TOURN SGL QUICK 34 (TOURNIQUET CUFF)
CUFF TRNQT CYL 24X4X16.5-23 (TOURNIQUET CUFF) IMPLANT
CUFF TRNQT CYL 34X4.125X (TOURNIQUET CUFF) IMPLANT
DRAPE 3/4 80X56 (DRAPES) ×2 IMPLANT
DRAPE IMP U-DRAPE 54X76 (DRAPES) ×2 IMPLANT
DRILL QUICK RELEASE 1/8 INCH (DRILL) ×1
DRSG MEPILEX SACRM 8.7X9.8 (GAUZE/BANDAGES/DRESSINGS) IMPLANT
DRSG OPSITE POSTOP 4X10 (GAUZE/BANDAGES/DRESSINGS) ×2 IMPLANT
DRSG OPSITE POSTOP 4X8 (GAUZE/BANDAGES/DRESSINGS) ×2 IMPLANT
ELECT REM PT RETURN 9FT ADLT (ELECTROSURGICAL) ×2
ELECTRODE REM PT RTRN 9FT ADLT (ELECTROSURGICAL) ×1 IMPLANT
FEMORAL CR LEFT 65MM (Joint) ×2 IMPLANT
GAUZE 4X4 16PLY ~~LOC~~+RFID DBL (SPONGE) ×2 IMPLANT
GAUZE XEROFORM 1X8 LF (GAUZE/BANDAGES/DRESSINGS) ×2 IMPLANT
GLOVE SRG 8 PF TXTR STRL LF DI (GLOVE) ×1 IMPLANT
GLOVE SURG ENC MOIS LTX SZ7.5 (GLOVE) ×8 IMPLANT
GLOVE SURG ENC MOIS LTX SZ8 (GLOVE) ×8 IMPLANT
GLOVE SURG UNDER LTX SZ8 (GLOVE) ×2 IMPLANT
GLOVE SURG UNDER POLY LF SZ8 (GLOVE) ×1
GOWN STRL REUS W/ TWL LRG LVL3 (GOWN DISPOSABLE) ×1 IMPLANT
GOWN STRL REUS W/ TWL XL LVL3 (GOWN DISPOSABLE) ×1 IMPLANT
GOWN STRL REUS W/TWL LRG LVL3 (GOWN DISPOSABLE) ×1
GOWN STRL REUS W/TWL XL LVL3 (GOWN DISPOSABLE) ×1
HOOD PEEL AWAY FLYTE STAYCOOL (MISCELLANEOUS) ×8 IMPLANT
INSERT TIB BEARING 67X10 (Insert) ×2 IMPLANT
IV NS IRRIG 3000ML ARTHROMATIC (IV SOLUTION) ×2 IMPLANT
KIT TURNOVER KIT A (KITS) ×2 IMPLANT
MANIFOLD NEPTUNE II (INSTRUMENTS) ×2 IMPLANT
NEEDLE SPNL 20GX3.5 QUINCKE YW (NEEDLE) ×2 IMPLANT
NS IRRIG 1000ML POUR BTL (IV SOLUTION) ×2 IMPLANT
PACK TOTAL KNEE (MISCELLANEOUS) ×2 IMPLANT
PAD WRAPON POLAR KNEE (MISCELLANEOUS) ×1 IMPLANT
PENCIL SMOKE EVACUATOR (MISCELLANEOUS) ×2 IMPLANT
PLATE INTERLOK 6700 (Plate) ×2 IMPLANT
PULSAVAC PLUS IRRIG FAN TIP (DISPOSABLE) ×2
SPONGE T-LAP 18X18 ~~LOC~~+RFID (SPONGE) ×6 IMPLANT
STAPLER SKIN PROX 35W (STAPLE) ×2 IMPLANT
SUCTION FRAZIER HANDLE 10FR (MISCELLANEOUS) ×1
SUCTION TUBE FRAZIER 10FR DISP (MISCELLANEOUS) ×1 IMPLANT
SUT VIC AB 0 CT1 36 (SUTURE) ×6 IMPLANT
SUT VIC AB 2-0 CT1 (SUTURE) ×2 IMPLANT
SUT VIC AB 2-0 CT1 27 (SUTURE) ×3
SUT VIC AB 2-0 CT1 TAPERPNT 27 (SUTURE) ×3 IMPLANT
SYR 10ML LL (SYRINGE) ×2 IMPLANT
SYR 20ML LL LF (SYRINGE) ×2 IMPLANT
SYR 30ML LL (SYRINGE) IMPLANT
TIP FAN IRRIG PULSAVAC PLUS (DISPOSABLE) ×1 IMPLANT
TRAP FLUID SMOKE EVACUATOR (MISCELLANEOUS) ×2 IMPLANT
WRAPON POLAR PAD KNEE (MISCELLANEOUS) ×2

## 2021-04-15 NOTE — Op Note (Signed)
04/15/2021  1:08 PM  Patient:   Brandi Castro  Pre-Op Diagnosis:   Degenerative joint disease, left knee.  Post-Op Diagnosis:   Same  Procedure:   Left TKA using all-cemented Bi[employer] Vanguard system with a 65 mm PCR femur, a 67 mm tibial tray with a 10 mm anterior stabilized E-poly insert, and a 34 x 7.8 mm all-poly 3-pegged domed patella.  Surgeon:   Pascal Lux, MD  Assistant:   Cameron Proud, PA-C; Ardelle Park, PA-S  Anesthesia:   Spinal  Findings:   As above  Complications:   None  EBL:   10 cc  Fluids:   1000 cc crystalloid  UOP:   None  TT:   100 minutes at 300 mmHg  Drains:   None  Closure:   Staples  Implants:   As above  Brief Clinical Note:   The patient is a 65 year old female with a long history of progressively worsening left knee pain. The patient's symptoms have progressed despite medications, activity modification, injections, etc. The patient's history and examination were consistent with advanced degenerative joint disease of the left knee confirmed by plain radiographs and preoperative MRI scanning. The patient presents at this time for a left total knee arthroplasty.  Procedure:   The patient was brought into the operating room. After adequate spinal anesthesia was obtained, the patient was lain in the supine position before the left lower extremity was prepped with ChloraPrep solution and draped sterilely. Preoperative antibiotics were administered. After verifying the proper laterality with a surgical timeout, the limb was exsanguinated with an Esmarch and the tourniquet inflated to 300 mmHg. A standard anterior approach to the knee was made through an approximately 7 inch incision. The incision was carried down through the subcutaneous tissues to expose superficial retinaculum. This was split the length of the incision and the medial flap elevated sufficiently to expose the medial retinaculum. The medial retinaculum was incised, leaving a 3-4 mm cuff  of tissue on the patella. This was extended distally along the medial border of the patellar tendon and proximally through the medial third of the quadriceps tendon. A subtotal fat pad excision was performed before the soft tissues were elevated off the anteromedial and anterolateral aspects of the proximal tibia to the level of the collateral ligaments. The anterior portions of the medial and lateral menisci were removed, as was the anterior cruciate ligament. With the knee flexed to 90, the external tibial guide was positioned and the appropriate proximal tibial cut made. This piece was taken to the back table where it was measured and found to be optimally replicated by a 67 mm component.  Attention was directed to the distal femur. The intramedullary canal was accessed through a 3/8" drill hole. The intramedullary guide was inserted and positioned in order to obtain a neutral flexion gap. The intercondylar block was positioned with care taken to avoid notching the anterior cortex of the femur. The appropriate cut was made. Next, the distal cutting block was placed at 5 of valgus alignment. Using the 9 mm slot, the distal cut was made. The distal femur was measured and found to be optimally replicated by the 65 mm component. The 65 mm 4-in-1 cutting block was positioned and first the posterior, then the posterior chamfer, the anterior chamfer, and finally the anterior cuts were made. At this point, the posterior portions medial and lateral menisci were removed. A trial reduction was performed using the appropriate femoral and tibial components with the 10 mm  insert. This demonstrated excellent stability to varus and valgus stressing both in flexion and extension while permitting full extension. Patella tracking was assessed and found to be excellent. Therefore, the tibial guide position was marked on the proximal tibia. The patella thickness was measured and found to be 20 mm. Therefore, the appropriate cut  was made. The patellar surface was measured and found to be optimally replicated by the 34 mm component. The three peg holes were drilled in place before the trial button was inserted. Patella tracking was assessed and found to be excellent, passing the "no thumb test". The lug holes were drilled into the distal femur before the trial component was removed, leaving only the tibial tray. The keel was then created using the appropriate tower, reamer, and punch.  The bony surfaces were prepared for cementing by irrigating them thoroughly with sterile saline solution via the jet lavage system. A bone plug was fashioned from some of the bone that had been removed previously and used to plug the distal femoral canal. In addition, 20 cc of Exparel diluted out to 60 cc with normal saline and 30 cc of 0.5% Sensorcaine were injected into the postero-medial and postero-lateral aspects of the knee, the medial and lateral gutter regions, and the peri-incisional tissues to help with postoperative analgesia. Meanwhile, the cement was being mixed on the back table. When it was ready, the tibial tray was cemented in first. The excess cement was removed using [employer] fast streamer. Next, the femoral component was impacted into place. Again, the excess cement was removed using [employer] fast streamer. The 10 mm trial insert was positioned and the knee brought into extension while the cement hardened. Finally, the patella was cemented into place and secured using the patellar clamp. Again, the excess cement was removed using [employer] fast streamer. Once the cement had hardened, the knee was placed through a range of motion with the findings as described above. Therefore, the trial insert was removed and, after verifying that no cement had been retained posteriorly, the permanent 10 mm anterior stabilized E-polyethylene insert was positioned and secured using the appropriate key locking mechanism. Again the knee was placed through a range of motion with  the findings as described above.  The wound was copiously irrigated with sterile saline solution using the jet lavage system before the quadriceps tendon and retinacular layer were reapproximated using #0 Vicryl interrupted sutures. The superficial retinacular layer also was closed using a running #0 Vicryl suture. A total of 10 cc of transexemic acid (TXA) was injected intra-articularly before the subcutaneous tissues were closed in several layers using 2-0 Vicryl interrupted sutures. The skin was closed using staples. A sterile honeycomb dressing was applied to the skin before the leg was wrapped with an Ace wrap to accommodate the Polar Care device. The patient was then awakened and returned to the recovery room in satisfactory condition after tolerating the procedure well.

## 2021-04-15 NOTE — H&P (Signed)
History of Present Illness: Brandi Castro is a 65 y.o.female who is being seen in consultation at the request of Dr. Leim Fabry for left knee pain. The symptoms began 4 months ago and developed without any specific cause or injury. However, the patient does recall falling twice at work about 3 years ago and wonders if this may have contributed to her symptoms. She saw Reche Dixon, PA-C, who gave her a steroid injection which provided only 1 weeks worth of relief before her symptoms recurred. Therefore, the patient was sent for an MRI scan and referred to Dr. Posey Pronto for further evaluation and treatment. The patient was given the choice of an arthroscopic partial meniscectomy versus a total knee arthroplasty, according to his note. The patient elected to think about her options at the time. She now presents for further evaluation and treatment. She reports 7/10 pain on today's visit. The pain is located along the anterior and medial aspect of the knee. The pain is described as aching, dull, stabbing and throbbing. The symptoms are aggravated with normal daily activities, with sleeping, using stairs, at higher levels of activity, rising from a chair, walking, standing and activity in general. She also describes no mechanical symptoms. She has associated swelling and no deformity. She has tried acetaminophen, over-the-counter medications, anti-inflammatories, narcotic medications, bracing and steroid injections with limited benefit.  Current Outpatient Medications:  fluticasone propionate (FLONASE) 50 mcg/actuation nasal spray Place 2 sprays into both nostrils once daily 48 g 3   acetaminophen-codeine (TYLENOL #3) 300-30 mg tablet Take 1 tablet by mouth every 4 (four) hours as needed for Pain   amLODIPine (NORVASC) 10 MG tablet Take 1 tablet (10 mg total) by mouth once daily 90 tablet 3   ascorbic acid-collagen (COLLAGEN PLUS VITAMIN C) 125-740 mg Cap Collagen per patient OTC 40 capsule 11   buPROPion (WELLBUTRIN  XL) 150 MG XL tablet Two by mouth daily. 180 tablet 3   calcium carbonate-vitamin D3 (CALTRATE 600+D) 600 mg(1,'500mg'$ ) -200 unit tablet Take 1 tablet by mouth once daily 30 tablet 11   celecoxib (CELEBREX) 200 MG capsule Take 200 mg by mouth once daily   enalapril (VASOTEC) 5 MG tablet Take 1 tablet (5 mg total) by mouth once daily 90 tablet 3   fluticasone propionate (FLONASE) 50 mcg/actuation nasal spray Place 2 sprays into both nostrils once daily as needed for up to 357 days (Patient not taking: Reported on 10/07/2020 ) 48 g 3   OMEGA-3S/DHA/EPA/FISH OIL (OMEGA 3 ORAL) Take by mouth.   pravastatin (PRAVACHOL) 10 MG tablet Take 1 tablet (10 mg total) by mouth nightly 90 tablet 3   traZODone (DESYREL) 100 MG tablet Take up to 1-1.5 nightly for insomnia. Attempt to wean down on dose please due to increasing age. 120 tablet 3   Allergies:   Aspirin Nausea   Onion Swelling (Raw onions)   Past Medical History:   Abnormal Pap smear (HISTORY OF in her 89s)   Depression   Fibromyalgia   GERD (gastroesophageal reflux disease)   Heart murmur, unspecified (surgery, Patent ductus as a 65 year old fixed)   Hyperlipidemia   Hypertension   IBS (irritable bowel syndrome)   Insomnia   OSA (obstructive sleep apnea) - not on cpap (not severe enough)   Primary osteoarthritis of left knee 11/19/2020   Stress fracture left foot    Past Surgical History:   CESAREAN SECTION X 2   COLONOSCOPY 06/07/2007 Dr. Ivor Messier - Nml, 10 yr rpt per [facility]  COLONOSCOPY 01/04/2020 (Diverticulosis/Repeat 72yr/[facility])   PATENT DUCTS ARTERIOSIS REPAIR AGE 37   wisdom teeth removal Bilateral (all 4)   Family History:   Cancer Father (Prostate)   Gout Father   Glaucoma Father   High blood pressure (Hypertension) Father   Prostate cancer Father   Skin cancer Father   Breast cancer Mother (m24 had lumps removed in 340's   Depression Mother (suicide)   Cancer Maternal Aunt (Breast)   Rheum arthritis Paternal Aunt    Alzheimer's disease Maternal Grandmother   Breast cancer Maternal Aunt   Deep vein thrombosis (DVT or abnormal blood clot formation) Brother   Prostate cancer Brother   Skin cancer Brother   Skin cancer Maternal Grandfather   Skin cancer Paternal Grandfather   Stroke Paternal Grandfather   Social History:   Socioeconomic History:   Marital status: Widowed  Tobacco Use   Smoking status: Former Smoker  Quit date: 02/26/1978  Years since quitting: 43.0   Smokeless tobacco: Never Used  Vaping Use   Vaping Use: Never used  Substance and Sexual Activity   Alcohol use: Yes  Alcohol/week: 0.0 standard drinks  Comment: in actuality 2-3 a month on both   Drug use: No   Sexual activity: Yes  Other Topics Concern   Would you please tell uK[place]about the people who live in your home, your pets, or anything else important to your social life? Yes  Comment: I live with my daughter and her husband, They have 3 boys a  Social History Narrative:  Widow- husband passed suddenly from MI 07/15/2012, has kids, works at [employer] prior 1 ppd smoker x 10 years and quit in [id number] 1-2 alcoholic bev/week, 2 years college education. Has daughter and son-in-law husband and 3 kids. Now in a new relationship and has her own apartment. 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   Review of Systems:  A comprehensive 14 point ROS was performed, reviewed, and the pertinent orthopaedic findings are documented in the HPI.  Physical Exam: There were no vitals filed for this visit. General/Constitutional: The patient appears to be well-nourished, well-developed, and in no acute distress. Neuro/Psych: Normal mood and affect, oriented to person, place and time. Eyes: Non-icteric. Pupils are equal, round, and reactive to light, and exhibit synchronous movement. Lymphatic: No palpable adenopathy. Respiratory: Lungs clear to auscultation, Normal chest excursion, No wheezes and Non-labored breathing Cardiovascular: Regular rate and  rhythm. No murmurs. and No edema, swelling or tenderness, except as noted in detailed exam. Vascular: No edema, swelling or tenderness, except as noted in detailed exam. Integumentary: No impressive skin lesions present, except as noted in detailed exam. Musculoskeletal: Unremarkable, except as noted in detailed exam.  Left knee exam: GAIT: Minimal limp, favoring her left leg, and uses no assistive devices. ALIGNMENT: normal SKIN: unremarkable SWELLING: mild EFFUSION: small WARMTH: no warmth TENDERNESS: mild over the medial joint line ROM: 0 to 125 degrees with pain in maximal flexion McMURRAY'S: equivocal PATELLOFEMORAL: normal tracking with no peri-patellar tenderness and negative apprehension sign CREPITUS: Minimal patellofemoral crepitance LACHMAN'S: negative PIVOT SHIFT: negative ANTERIOR DRAWER: negative POSTERIOR DRAWER: negative VARUS/VALGUS: stable  She is neurovascularly intact to the left lower extremity and foot.  Knee Imaging: Recent AP weightbearing of both knees, as well as lateral and merchant views of the left knee are available for review and have been reviewed by myself. These films demonstrate mild degenerative changes, primarily involving the medial compartment with 20% edial joint space narrowing. Overall alignment is neutral. No fractures, lytic lesions,  or abnormal calcifications are noted.  Knee Imaging, external: Left knee: A recent MRI scan of the left knee is available for review. By report, the scan demonstrates evidence of a "small radial tear involving the root of the posterior horn" with "mild peripheral extrusion of the meniscus". There is evidence of moderate degenerative changes of the medial compartment as well as mild degenerative changes of the lateral patellofemoral compartments. She also has a "moderate sized complex Baker's cyst". Both the films and report were reviewed by myself and discussed with the patient.  Assessment:  Primary  osteoarthritis of left knee.   Plan: The treatment options were discussed with the patient. In addition, patient educational materials were provided regarding the diagnosis and treatment options. The patient is quite frustrated by her symptoms and functional limitations, and is ready to consider more aggressive treatment options. Therefore, I have recommended a surgical procedure, specifically a left total knee arthroplasty. The procedure was discussed with the patient, as were the potential risks (including bleeding, infection, nerve and/or blood vessel injury, persistent or recurrent pain, loosening and/or failure of the components, dislocation, need for further surgery, blood clots, strokes, heart attacks and/or arhythmias, pneumonia, etc.) and benefits. The patient states his/her understanding and wishes to proceed. All of the patient's questions and concerns were answered. She can call any time with further concerns. She will follow up post-surgery, routine.   H&P reviewed and patient re-examined. No changes.

## 2021-04-15 NOTE — Anesthesia Procedure Notes (Signed)
Spinal  Patient location during procedure: OR Reason for block: surgical anesthesia Staffing Performed: anesthesiologist  Preanesthetic Checklist Completed: patient identified, IV checked, site marked, risks and benefits discussed, surgical consent, monitors and equipment checked, pre-op evaluation and timeout performed Spinal Block Patient position: sitting Prep: Betadine Patient monitoring: heart rate, cardiac monitor, continuous pulse ox and blood pressure Approach: midline Location: L4-5 Injection technique: single-shot Needle Needle type: Quincke  Needle gauge: 22 G Needle length: 9 cm Assessment Sensory level: T4 Events: CSF return

## 2021-04-15 NOTE — Anesthesia Preprocedure Evaluation (Signed)
Anesthesia Evaluation  Patient identified by MRN, date of birth, ID band Patient awake    Reviewed: Allergy & Precautions, H&P , NPO status , Patient's Chart, lab work & pertinent test results, reviewed documented beta blocker date and time   Airway Mallampati: II  TM Distance: >3 FB Neck ROM: full    Dental  (+) Teeth Intact   Pulmonary sleep apnea , pneumonia, former smoker,    Pulmonary exam normal        Cardiovascular Exercise Tolerance: Good hypertension, On Medications negative cardio ROS Normal cardiovascular exam Rate:Normal     Neuro/Psych PSYCHIATRIC DISORDERS Depression  Neuromuscular disease    GI/Hepatic negative GI ROS, Neg liver ROS,   Endo/Other  negative endocrine ROS  Renal/GU negative Renal ROS  negative genitourinary   Musculoskeletal   Abdominal   Peds  Hematology negative hematology ROS (+)   Anesthesia Other Findings   Reproductive/Obstetrics negative OB ROS                             Anesthesia Physical Anesthesia Plan  ASA: 2  Anesthesia Plan: General LMA   Post-op Pain Management:    Induction:   PONV Risk Score and Plan: 4 or greater  Airway Management Planned:   Additional Equipment:   Intra-op Plan:   Post-operative Plan:   Informed Consent: I have reviewed the patients History and Physical, chart, labs and discussed the procedure including the risks, benefits and alternatives for the proposed anesthesia with the patient or authorized representative who has indicated his/her understanding and acceptance.       Plan Discussed with: CRNA  Anesthesia Plan Comments:         Anesthesia Quick Evaluation

## 2021-04-15 NOTE — Transfer of Care (Signed)
Immediate Anesthesia Transfer of Care Note  Patient: Brandi Castro  Procedure(s) Performed: TOTAL KNEE ARTHROPLASTY (Left: Knee)  Patient Location: PACU  Anesthesia Type:General  Level of Consciousness: awake, drowsy and patient cooperative  Airway & Oxygen Therapy: Patient Spontanous Breathing  Post-op Assessment: Report given to RN and Post -op Vital signs reviewed and stable  Post vital signs: Reviewed and stable  Last Vitals:  Vitals Value Taken Time  BP 112/66 04/15/21 1304  Temp    Pulse 73 04/15/21 1311  Resp 18 04/15/21 1311  SpO2 91 % 04/15/21 1311  Vitals shown include unvalidated device data.  Last Pain:  Vitals:   04/15/21 0834  TempSrc: Temporal  PainSc: 3          Complications: No notable events documented.

## 2021-04-16 ENCOUNTER — Encounter: Payer: Self-pay | Admitting: Surgery

## 2021-04-16 DIAGNOSIS — M1712 Unilateral primary osteoarthritis, left knee: Secondary | ICD-10-CM | POA: Diagnosis not present

## 2021-04-16 LAB — CBC
HCT: 35.3 % — ABNORMAL LOW (ref 36.0–46.0)
Hemoglobin: 12.3 g/dL (ref 12.0–15.0)
MCH: 30.4 pg (ref 26.0–34.0)
MCHC: 34.8 g/dL (ref 30.0–36.0)
MCV: 87.2 fL (ref 80.0–100.0)
Platelets: 246 10*3/uL (ref 150–400)
RBC: 4.05 MIL/uL (ref 3.87–5.11)
RDW: 12.5 % (ref 11.5–15.5)
WBC: 16.8 10*3/uL — ABNORMAL HIGH (ref 4.0–10.5)
nRBC: 0 % (ref 0.0–0.2)

## 2021-04-16 LAB — BASIC METABOLIC PANEL
Anion gap: 11 (ref 5–15)
BUN: 10 mg/dL (ref 8–23)
CO2: 23 mmol/L (ref 22–32)
Calcium: 8.9 mg/dL (ref 8.9–10.3)
Chloride: 103 mmol/L (ref 98–111)
Creatinine, Ser: 0.94 mg/dL (ref 0.44–1.00)
GFR, Estimated: >60 mL/min (ref >60)
Glucose, Bld: 155 mg/dL — ABNORMAL HIGH (ref 70–99)
Potassium: 3.9 mmol/L (ref 3.5–5.1)
Sodium: 137 mmol/L (ref 135–145)

## 2021-04-16 MED ORDER — APIXABAN 2.5 MG PO TABS: 2.5000 mg | ORAL_TABLET | Freq: Two times a day (BID) | ORAL | 0 refills | Status: AC

## 2021-04-16 MED ORDER — ONDANSETRON HCL 4 MG PO TABS: 4.0000 mg | ORAL_TABLET | Freq: Four times a day (QID) | ORAL | 0 refills | Status: AC | PRN

## 2021-04-16 MED ORDER — OXYCODONE HCL 5 MG PO TABS: 5.0000 mg | ORAL_TABLET | ORAL | 0 refills | Status: AC | PRN

## 2021-04-16 MED ORDER — TRAMADOL HCL 50 MG PO TABS: 50.0000 mg | ORAL_TABLET | Freq: Four times a day (QID) | ORAL | 0 refills | Status: AC | PRN

## 2021-04-16 MED ORDER — OXYCODONE HCL 5 MG PO TABS: 5.0000 mg | ORAL_TABLET | ORAL | 0 refills | Status: DC | PRN

## 2021-04-16 MED ORDER — APIXABAN 2.5 MG PO TABS
2.5000 mg | ORAL_TABLET | Freq: Two times a day (BID) | ORAL | 0 refills | Status: DC
Start: 2021-04-16 — End: 2021-04-16

## 2021-04-16 MED ORDER — TRAMADOL HCL 50 MG PO TABS: 50.0000 mg | ORAL_TABLET | Freq: Four times a day (QID) | ORAL | 0 refills | Status: DC | PRN

## 2021-04-16 MED ORDER — ONDANSETRON HCL 4 MG PO TABS: 4.0000 mg | ORAL_TABLET | Freq: Four times a day (QID) | ORAL | 0 refills | Status: DC | PRN

## 2021-04-16 MED ORDER — FAMOTIDINE 20 MG PO TABS: 20.0000 mg | ORAL_TABLET | Freq: Every day | ORAL | Status: DC | PRN

## 2021-04-16 NOTE — Progress Notes (Signed)
  Subjective: 1 Day Post-Op Procedure(s) (LRB): TOTAL KNEE ARTHROPLASTY (Left) Patient reports pain as 6 on 0-10 scale.   Patient is well, and has had no acute complaints or problems Plan is to go Home after hospital stay. Negative for chest pain and shortness of breath Fever: no Gastrointestinal:Negative for nausea and vomiting Patient reports that she is passing gas without pain.  Objective: Vital signs in last 24 hours: Temp:  [97.6 F (36.4 C)-98.4 F (36.9 C)] 98.4 F (36.9 C) (08/10 0450) Pulse Rate:  [60-93] 65 (08/10 0450) Resp:  [15-21] 20 (08/10 0450) BP: (112-145)/(59-81) 121/71 (08/10 0450) SpO2:  [92 %-99 %] 93 % (08/10 0450) Weight:  [82.6 kg] 82.6 kg (08/09 0834)  Intake/Output from previous day:  Intake/Output Summary (Last 24 hours) at 04/16/2021 0744 Last data filed at 04/16/2021 0000 Gross per 24 hour  Intake 1780 ml  Output 710 ml  Net 1070 ml    Intake/Output this shift: No intake/output data recorded.  Labs: Recent Labs    04/16/21 0449  HGB 12.3   Recent Labs    04/16/21 0449  WBC 16.8*  RBC 4.05  HCT 35.3*  PLT 246   Recent Labs    04/16/21 0449  NA 137  K 3.9  CL 103  CO2 23  BUN 10  CREATININE 0.94  GLUCOSE 155*  CALCIUM 8.9   No results for input(s): LABPT, INR in the last 72 hours.   EXAM General - Patient is Alert, Appropriate, and Oriented Extremity - ABD soft Sensation intact distally Intact pulses distally Dorsiflexion/Plantar flexion intact Incision: scant drainage No cellulitis present Compartment soft Dressing/Incision - blood tinged drainage, very mild Motor Function - intact, moving foot and toes well on exam.  Abdomen soft with normal bowel sounds.  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   Weight as of this encounter: 82.6 kg. Advance diet Up with therapy D/C IV fluids when tolerating po intake.  Labs reviewed, WBC 16.8 this AM. Denies any SOB, chest pain, or abdominal pain.  Likely reactive from surgery. Encouraged incentive spirometer. Up with therapy today. Patient is passing gas without issues. Possible discharge home today vs. Tomorrow pending progress with PT.  DVT Prophylaxis - TED hose and Eliquis Weight-Bearing as tolerated to left leg  J. Cameron Proud, PA-C [facility] 04/16/2021, 7:44 AM

## 2021-04-16 NOTE — Progress Notes (Signed)
Physical Therapy Treatment Patient Details Name: Brandi Castro MRN: [id number] DOB: 1956-02-28 Today's Date: 04/16/2021    History of Present Illness Pt is a 65 y.o. female s/p L TKA secondary to DJD 04/15/21.  PMH includes sleep apnea, PNA, htn, and depression.    PT Comments    Pt was sitting independently EOB upon arriving. She agrees to session and is highly motivated." I want to go home later today if I can." Pt was easily able to stand and ambulate with RW without LOB or safety concern. Ambulated a total of 200 ft, performed ascending descending stairs, and performed up/down 1 step to simulate home environment. PT also able to safely demonstrate HEP without assistance. Pt is cleared from a PT standpoint to safely go home with family support.   Follow Up Recommendations  Home health PT     Equipment Recommendations  Rolling walker with 5" wheels;3in1 (PT)       Precautions / Restrictions Precautions Precautions: Fall;Knee Precaution Booklet Issued: Yes (comment) Restrictions Weight Bearing Restrictions: Yes LLE Weight Bearing: Weight bearing as tolerated    Mobility  Bed Mobility      General bed mobility comments: pt was in recliner pre/post session    Transfers Overall transfer level: Modified independent Equipment used: Rolling walker (2 wheeled) Transfers: Sit to/from Stand Sit to Stand: Supervision         General transfer comment: No physical assistance required to perform sit to stand. demonstrated safe abilities throughout  Ambulation/Gait Ambulation/Gait assistance: Supervision Gait Distance (Feet): 200 Feet Assistive device: Rolling walker (2 wheeled) Gait Pattern/deviations: Step-through pattern;Antalgic Gait velocity: decreased   General Gait Details: pt demonstrated safe ability to ambulate with RW without LOB or safety concern   Stairs Stairs: Yes Stairs assistance: Supervision Stair Management: One rail Left Number of Stairs: 4 General  stair comments: pt performed 4 stair with +1 rail and performed 1 step with use of RW only    Balance Overall balance assessment: Needs assistance Sitting-balance support: No upper extremity supported;Feet supported Sitting balance-Leahy Scale: Normal Sitting balance - Comments: steady sitting reaching outside BOS   Standing balance support: No upper extremity supported Standing balance-Leahy Scale: Good Standing balance comment: no balance deficits noted throughout session      Cognition Arousal/Alertness: Awake/alert Behavior During Therapy: WFL for tasks assessed/performed Overall Cognitive Status: Within Functional Limits for tasks assessed      General Comments: pt is A and O x 4             Pertinent Vitals/Pain Pain Assessment: 0-10 Pain Score: 4  Pain Location: L knee Pain Descriptors / Indicators: Discomfort;Sore;Tender Pain Intervention(s): Limited activity within patient's tolerance;Monitored during session;Premedicated before session;Repositioned;Ice applied     PT Goals (current goals can now be found in the care plan section) Acute Rehab PT Goals Patient Stated Goal: to go home Progress towards PT goals: Progressing toward goals    Frequency    BID      PT Plan Current plan remains appropriate       AM-PAC PT "6 Clicks" Mobility   Outcome Measure  Help needed turning from your back to your side while in a flat bed without using bedrails?: None Help needed moving from lying on your back to sitting on the side of a flat bed without using bedrails?: None Help needed moving to and from a bed to a chair (including a wheelchair)?: None Help needed standing up from a chair using your arms (e.g., wheelchair or  bedside chair)?: A Little Help needed to walk in hospital room?: A Little Help needed climbing 3-5 steps with a railing? : A Little 6 Click Score: 21    End of Session Equipment Utilized During Treatment: Gait belt Activity Tolerance: Patient  tolerated treatment well Patient left: in chair;with call bell/phone within reach;with chair alarm set Nurse Communication: Mobility status;Precautions;Weight bearing status PT Visit Diagnosis: Other abnormalities of gait and mobility (R26.89);Muscle weakness (generalized) (M62.81);Difficulty in walking, not elsewhere classified (R26.2);Pain Pain - Right/Left: Left Pain - part of body: Knee     Time: 1425-1450 PT Time Calculation (min) (ACUTE ONLY): 25 min  Charges:  $Gait Training: 23-37 mins                     Julaine Fusi PTA 04/16/21, 3:04 PM

## 2021-04-16 NOTE — Evaluation (Signed)
Physical Therapy Evaluation Patient Details Name: Brandi Castro MRN: [id number] DOB: 1956/04/12 Today's Date: 04/16/2021   History of Present Illness  Pt is a 65 y.o. female s/p L TKA secondary to DJD 04/15/21.  PMH includes sleep apnea, PNA, htn, and depression.  Clinical Impression  Prior to hospital admission, pt was independent with functional mobility; lives alone but has family support at hospital discharge (pt's daughter and 49 y.o. grandson plan to assist pt with navigating steps into home).  Currently pt is modified independent with bed mobility; SBA with transfers; and CGA to SBA ambulating 120 feet with RW.  Pt's L knee pain 4-5/10 at rest beginning of session and increased to 8/10 with ambulation (nurse notified of pt's pain status); polar care applied; pt received pain medication prior to PT session.  Pt would benefit from skilled PT to address noted impairments and functional limitations (see below for any additional details).  Upon hospital discharge, pt would benefit from [place].      Follow Up Recommendations Home health PT    Equipment Recommendations  Rolling walker with 5" wheels;3in1 (PT)    Recommendations for Other Services       Precautions / Restrictions Precautions Precautions: Fall;Knee Precaution Booklet Issued: Yes (comment) Restrictions Weight Bearing Restrictions: Yes LLE Weight Bearing: Weight bearing as tolerated      Mobility  Bed Mobility Overal bed mobility: Modified Independent             General bed mobility comments: HOB elevated; pt using B UE's to assist L LE into bed    Transfers Overall transfer level: Needs assistance Equipment used: Rolling walker (2 wheeled) Transfers: Sit to/from Stand Sit to Stand: Supervision         General transfer comment: initial vc's for UE and LE positioning during transfers (and walker use)  Ambulation/Gait Ambulation/Gait assistance: Min guard;Supervision Gait Distance (Feet): 120  Feet Assistive device: Rolling walker (2 wheeled)   Gait velocity: decreased   General Gait Details: antalgic; decreased stance time L LE; partial step through gait pattern; vc's to increase UE support through RW to offweight L LE for pain control towards end of ambulation  Stairs            Wheelchair Mobility    Modified Rankin (Stroke Patients Only)       Balance Overall balance assessment: Needs assistance Sitting-balance support: No upper extremity supported;Feet supported Sitting balance-Leahy Scale: Normal Sitting balance - Comments: steady sitting reaching outside BOS   Standing balance support: No upper extremity supported Standing balance-Leahy Scale: Fair Standing balance comment: steady static standing                             Pertinent Vitals/Pain Pain Assessment: 0-10 Pain Score: 8  Pain Location: L knee Pain Descriptors / Indicators: Discomfort;Sore;Tender Pain Intervention(s): Limited activity within patient's tolerance;Monitored during session;Premedicated before session;Repositioned;Other (comment) (polar care applied) HR 59-90 bpm and O2 sats 92% or greater on room air during sessions activities.    Home Living Family/patient expects to be discharged to:: Private residence Living Arrangements: Alone Available Help at Discharge: Family;Available PRN/intermittently Type of Home: House Home Access: Stairs to enter   [employer] of Steps: 2 steps with B rails (unable to reach both at same time) and then 1 step (door frame to hold onto) to enter home Home Layout: One level Home Equipment: None Additional Comments: Pt's daughter plans to stay days and 67 y.o.  grandson (who will call his mom if pt needs any physical assist) plans to stay nights with pt for next 2 weeks.    Prior Function Level of Independence: Independent         Comments: No falls in past 6 months.     Hand Dominance        Extremity/Trunk  Assessment   Upper Extremity Assessment Upper Extremity Assessment: Overall WFL for tasks assessed    Lower Extremity Assessment Lower Extremity Assessment: LLE deficits/detail (R LE WFL) LLE Deficits / Details: fair to good L isometric quad set strength; at least 3/5 AROM ankle DF/PF; minimal assist for L LE SLR LLE: Unable to fully assess due to pain    Cervical / Trunk Assessment Cervical / Trunk Assessment: Normal  Communication   Communication: No difficulties  Cognition Arousal/Alertness: Awake/alert Behavior During Therapy: WFL for tasks assessed/performed Overall Cognitive Status: Within Functional Limits for tasks assessed                                        General Comments General comments (skin integrity, edema, etc.): L knee dressings and polar care in place.  Nursing cleared pt for participation in physical therapy.  Pt agreeable to PT session.    Exercises Total Joint Exercises Ankle Circles/Pumps: AROM;Strengthening;Both;10 reps;Supine Quad Sets: AROM;Strengthening;Both;10 reps;Supine Short Arc Quad: AROM;Strengthening;Left;10 reps;Supine Heel Slides: AAROM;Strengthening;Left;10 reps;Supine Hip ABduction/ADduction: AAROM;Strengthening;Left;10 reps;Supine Straight Leg Raises: AAROM;Strengthening;Left;10 reps;Supine Goniometric ROM: L knee 0-70 degrees (semi-supine in bed)   Assessment/Plan    PT Assessment Patient needs continued PT services  PT Problem List Decreased strength;Decreased range of motion;Decreased activity tolerance;Decreased balance;Decreased mobility;Decreased knowledge of use of DME;Decreased knowledge of precautions;Pain;Decreased skin integrity       PT Treatment Interventions DME instruction;Gait training;Stair training;Functional mobility training;Therapeutic activities;Therapeutic exercise;Balance training;Patient/family education    PT Goals (Current goals can be found in the Care Plan section)  Acute Rehab PT  Goals Patient Stated Goal: to go home PT Goal Formulation: With patient Time For Goal Achievement: 04/30/21 Potential to Achieve Goals: Good    Frequency BID   Barriers to discharge        Co-evaluation               AM-PAC PT "6 Clicks" Mobility  Outcome Measure Help needed turning from your back to your side while in a flat bed without using bedrails?: None Help needed moving from lying on your back to sitting on the side of a flat bed without using bedrails?: None Help needed moving to and from a bed to a chair (including a wheelchair)?: A Little Help needed standing up from a chair using your arms (e.g., wheelchair or bedside chair)?: A Little Help needed to walk in hospital room?: A Little Help needed climbing 3-5 steps with a railing? : A Little 6 Click Score: 20    End of Session Equipment Utilized During Treatment: Gait belt Activity Tolerance: Patient limited by pain Patient left: in bed;with call bell/phone within reach;with bed alarm set;with SCD's reapplied;Other (comment) (L heel floating via towel roll; R heel floating via pillow support; polar care applied and activated) Nurse Communication: Mobility status;Precautions;Weight bearing status;Other (comment) (pt's pain status) PT Visit Diagnosis: Other abnormalities of gait and mobility (R26.89);Muscle weakness (generalized) (M62.81);Difficulty in walking, not elsewhere classified (R26.2);Pain Pain - Right/Left: Left Pain - part of body: Knee    Time: [id number] PT Time  Calculation (min) (ACUTE ONLY): 37 min   Charges:   PT Evaluation $PT Eval Low Complexity: 1 Low PT Treatments $Therapeutic Exercise: 8-22 mins $Therapeutic Activity: 8-22 mins       Leitha Bleak, PT 04/16/21, 10:31 AM

## 2021-04-16 NOTE — Discharge Summary (Signed)
Physician Discharge Summary  Patient ID: Brandi Castro MRN: [id number] DOB/AGE: Jan 05, 1956 65 y.o.  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   Consultants (if any):   Discharged Condition: Improved  Hospital Course: Brandi Castro is an 65 y.o. adult who was admitted 04/15/2021 with a diagnosis of degenerative joint disease of the left knee and went to the operating room on 04/15/2021 and underwent the above named procedures.    Surgeries: Procedure(s): TOTAL KNEE ARTHROPLASTY on 04/15/2021 Patient tolerated the surgery well. Taken to PACU where she was stabilized and then transferred to the orthopedic floor.  Started on Eliquis 2.'5mg'$  twice daily for DVT prevention. Foot pumps applied bilaterally at 80 mm. Heels elevated on bed with rolled towels. No evidence of DVT. Negative [place]. Physical therapy started on day #1 for gait training and transfer. OT started day #1 for ADL and assisted devices.  Patient's IV was removed on POD1.  Implants: Left TKA using all-cemented [employer] Vanguard system with a 65 mm PCR femur, a 67 mm tibial tray with a 10 mm anterior stabilized E-poly insert, and a 34 x 7.8 mm all-poly 3-pegged domed patella.  She was given perioperative antibiotics:  Anti-infectives (From admission, onward)    Start     Dose/Rate Route Frequency Ordered Stop   04/15/21 1600  ceFAZolin (ANCEF) IVPB 2g/100 mL premix        2 g 200 mL/hr over 30 Minutes Intravenous Every 6 hours 04/15/21 1556 04/16/21  0531   04/15/21 0826  ceFAZolin (ANCEF) 2-4 GM/100ML-% IVPB       Note to Pharmacy: Jordan Hawks  : cabinet override      04/15/21 0826 04/15/21 1038   04/15/21 0600  ceFAZolin (ANCEF) IVPB 2g/100 mL premix        2 g 200 mL/hr over 30 Minutes Intravenous On call to O.R. 04/15/21 0211 04/15/21 1047     .  She was given sequential compression devices, early ambulation, and Eliquis for DVT prophylaxis.  She benefited maximally from the hospital stay and there were no complications.    Recent vital signs:  Vitals:   04/16/21 0450 04/16/21 1543  BP: 121/71 122/62  Pulse: 65 (!) 58  Resp: 20 17  Temp: 98.4 F (36.9 C) 98.1 F (36.7 C)  SpO2: 93% 96%    Recent laboratory studies:  Lab Results  Component Value Date   HGB 12.3 04/16/2021   HGB 14.1 04/08/2021   HGB 13.8 08/27/2014   Lab Results  Component Value Date   WBC 16.8 (H) 04/16/2021   PLT 246 04/16/2021   No results found for: INR Lab Results  Component Value Date   NA 137 04/16/2021   K 3.9 04/16/2021   CL 103 04/16/2021   CO2 23 04/16/2021   BUN 10 04/16/2021   CREATININE 0.94 04/16/2021   GLUCOSE 155 (H) 04/16/2021    Discharge Medications:   Allergies as of 04/16/2021  Reactions   Onion Nausea And Vomiting, Swelling   Raw onions   Aspirin Nausea Only   Headache        Medication List     TAKE these medications    acetaminophen 650 MG CR tablet Commonly known as: TYLENOL Take 1,300 mg by mouth daily as needed for pain.   amLODipine 10 MG tablet Commonly known as: NORVASC Take 10 mg by mouth at bedtime.   apixaban 2.5 MG Tabs tablet Commonly known as: ELIQUIS Take 1 tablet (2.5 mg total) by mouth 2 (two) times daily.   buPROPion 150 MG 24 hr tablet Commonly known as: WELLBUTRIN XL Take 300 mg by mouth daily.   celecoxib 200 MG capsule Commonly known as: CELEBREX Take 200 mg by mouth in the morning and at bedtime.   enalapril 5 MG tablet Commonly known as:  VASOTEC Take 5 mg by mouth at bedtime.   fluticasone 50 MCG/ACT nasal spray Commonly known as: FLONASE Place 1 spray into both nostrils daily as needed for allergies.   ondansetron 4 MG tablet Commonly known as: ZOFRAN Take 1 tablet (4 mg total) by mouth every 6 (six) hours as needed for nausea.   oxybutynin 5 MG tablet Commonly known as: DITROPAN Take 5 mg by mouth daily.   oxyCODONE 5 MG immediate release tablet Commonly known as: Oxy IR/ROXICODONE Take 1-2 tablets (5-10 mg total) by mouth every 4 (four) hours as needed for moderate pain (pain score 4-6).   pravastatin 10 MG tablet Commonly known as: PRAVACHOL Take 10 mg by mouth at bedtime.   traMADol 50 MG tablet Commonly known as: ULTRAM Take 1 tablet (50 mg total) by mouth every 6 (six) hours as needed for moderate pain.   traZODone 100 MG tablet Commonly known as: DESYREL Take 100 mg by mouth at bedtime.               Durable Medical Equipment  (From admission, onward)           Start     Ordered   04/15/21 1304  DME Bedside commode  Once       Question:  Patient needs a bedside commode to treat with the following condition  Answer:  Status post total knee replacement using cement, left   04/15/21 1303   04/15/21 1304  DME 3 n 1  Once        04/15/21 1303   04/15/21 1304  DME Walker rolling  Once       Question Answer Comment  Walker: With 5 Inch Wheels   Patient needs a walker to treat with the following condition Status post total knee replacement using cement, left      04/15/21 1303            Diagnostic Studies: DG Knee Left Port  Result Date: 04/15/2021 CLINICAL DATA:  Postop left knee. EXAM: PORTABLE LEFT KNEE - 1-2 VIEW COMPARISON:  None. FINDINGS: Left knee arthroplasty in expected alignment. No periprosthetic lucency or fracture. There has been patellar resurfacing. Recent postsurgical change includes air and edema in the soft tissues and joint space. Anterior skin staples in place.  IMPRESSION: Left knee arthroplasty without immediate postoperative complication. Electronically Signed   By: Keith Rake M.D.   On: 04/15/2021 15:13   MM 3D SCREEN BREAST BILATERAL  Result Date: 04/13/2021 CLINICAL DATA:  Screening. EXAM: DIGITAL SCREENING BILATERAL MAMMOGRAM WITH TOMOSYNTHESIS AND CAD TECHNIQUE: Bilateral screening digital craniocaudal and mediolateral oblique mammograms were obtained. Bilateral screening  digital breast tomosynthesis was performed. The images were evaluated with computer-aided detection. COMPARISON:  Previous exam(s). ACR Breast Density Category b: There are scattered areas of fibroglandular density. FINDINGS: There are no findings suspicious for malignancy. IMPRESSION: No mammographic evidence of malignancy. A result letter of this screening mammogram will be mailed directly to the patient. RECOMMENDATION: Screening mammogram in one year. (Code:[id number]-B-01Y) BI-RADS CATEGORY  1: Negative. Electronically Signed   By: Ammie Ferrier M.D.   On: 04/13/2021 10:09   Disposition: [facility] home with HHPT.   Follow-up Information     Lattie Corns, PA-C Follow up.   Specialty: Physician Assistant Why: Electa Sniff information: [place] [employer] [place] [id number] [phone]                Signed: Judson Roch PA-C 04/16/2021, 5:02 PM

## 2021-04-16 NOTE — Anesthesia Postprocedure Evaluation (Signed)
Anesthesia Post Note  Patient: Brandi Castro  Procedure(s) Performed: TOTAL KNEE ARTHROPLASTY (Left: Knee)  Patient location during evaluation: PACU Anesthesia Type: General Level of consciousness: awake and alert Pain management: pain level controlled Vital Signs Assessment: post-procedure vital signs reviewed and stable Respiratory status: spontaneous breathing, nonlabored ventilation, respiratory function stable and patient connected to nasal cannula oxygen Cardiovascular status: blood pressure returned to baseline and stable Postop Assessment: no apparent nausea or vomiting Anesthetic complications: no   No notable events documented.   Last Vitals:  Vitals:   04/16/21 0450 04/16/21 1543  BP: 121/71 122/62  Pulse: 65 (!) 58  Resp: 20 17  Temp: 36.9 C 36.7 C  SpO2: 93% 96%    Last Pain:  Vitals:   04/16/21 1500  TempSrc:   PainSc: [place] Chayne Baumgart

## 2021-04-16 NOTE — Discharge Instructions (Signed)
Diet: As you were doing prior to hospitalization   Shower:  May shower but keep the wounds dry, use an occlusive plastic wrap, NO SOAKING IN TUB.  If the bandage gets wet, change with a clean dry gauze.  Dressing:  You may change your dressing as needed. Change the dressing with sterile gauze dressing.    Activity:  Increase activity slowly as tolerated, but follow the weight bearing instructions below.  No lifting or driving for 6 weeks.  Weight Bearing:   Weight bearing as tolerated to the left lower extremity  To prevent constipation: you may use a stool softener such as -  Colace (over the counter) 100 mg by mouth twice a day  Drink plenty of fluids (prune juice may be helpful) and high fiber foods Miralax (over the counter) for constipation as needed.    Itching:  If you experience itching with your medications, try taking only a single pain pill, or even half a pain pill at a time.  You may take up to 10 pain pills per day, and you can also use benadryl over the counter for itching or also to help with sleep.   Precautions:  If you experience chest pain or shortness of breath - call 911 immediately for transfer to the hospital emergency department!!  If you develop a fever greater that 101 F, purulent drainage from wound, increased redness or drainage from wound, or calf pain-Call [place]                                              Follow- Up Appointment:  Please call for an appointment to be seen in 2 weeks at [facility]

## 2021-04-16 NOTE — Plan of Care (Signed)
Pts pain well-controlled. Pt used CPM for 6hrs this shift. Problem: Education: Goal: Knowledge of General Education information will improve Description: Including pain rating scale, medication(s)/side effects and non-pharmacologic comfort measures Outcome: Progressing   Problem: Health Behavior/Discharge Planning: Goal: Ability to manage health-related needs will improve Outcome: Progressing   Problem: Clinical Measurements: Goal: Ability to maintain clinical measurements within normal limits will improve Outcome: Progressing Goal: Will remain free from infection Outcome: Progressing Goal: Diagnostic test results will improve Outcome: Progressing Goal: Respiratory complications will improve Outcome: Progressing Goal: Cardiovascular complication will be avoided Outcome: Progressing   Problem: Activity: Goal: Risk for activity intolerance will decrease Outcome: Progressing   Problem: Nutrition: Goal: Adequate nutrition will be maintained Outcome: Progressing   Problem: Coping: Goal: Level of anxiety will decrease Outcome: Progressing   Problem: Elimination: Goal: Will not experience complications related to bowel motility Outcome: Progressing Goal: Will not experience complications related to urinary retention Outcome: Progressing   Problem: Pain Managment: Goal: General experience of comfort will improve Outcome: Progressing   Problem: Safety: Goal: Ability to remain free from injury will improve Outcome: Progressing   Problem: Skin Integrity: Goal: Risk for impaired skin integrity will decrease Outcome: Progressing

## 2021-04-16 NOTE — Progress Notes (Signed)
Met with the patient to discuss DC plan and needs She lives alone but her daughter and grandson is moving in for a few weeks to help out She needs a RW and a 3 in 1, [place] with [employer] will bring to the room, she is set up with [facility] services and they will call her She has transportation and can afford her medications

## 2021-04-16 NOTE — TOC Progression Note (Signed)
Transition of Care [facility]) - Progression Note    Patient Details  Name: LORE POLKA MRN: [id number] Date of Birth: 02-28-1956  Transition of Care [facility]) CM/SW [place], RN Phone Number: 04/16/2021, 3:39 PM  Clinical Narrative:   Met with the patient and completed the code 64    Expected Discharge Plan: [place] Barriers to Discharge: Barriers Resolved  Expected Discharge Plan and Services Expected Discharge Plan: [place]   Discharge Planning Services: CM Consult   Living arrangements for the past 2 months: Single Family Home                 DME Arranged: 3-N-1, Brace, LSO, Walker rolling DME Agency: [employer] Date DME Agency Contacted: 04/16/21 Time DME Agency Contacted: [id number] Representative spoke with at DME Agency: [place]: PT [place]: [place] Date [place]: 04/16/21 Time [place]: 1030 Representative spoke with at [place]: [place]   Social Determinants of Health ([place]) Interventions    Readmission Risk Interventions No flowsheet data found.

## 2021-04-16 NOTE — Care Management CC44 (Signed)
Condition Code 44 Documentation Completed  Patient Details  Name: AILEENA MASOOD MRN: [id number] Date of Birth: 10/26/1955   Condition Code 44 given:  Yes Patient signature on Condition Code 44 notice:  Yes Documentation of 2 MD's agreement:  Yes Code 44 added to claim:  Yes    Su Hilt, RN 04/16/2021, 3:38 PM

## 2021-04-16 NOTE — Plan of Care (Signed)

## 2021-04-30 ENCOUNTER — Other Ambulatory Visit: Payer: Self-pay | Admitting: Student

## 2021-04-30 ENCOUNTER — Ambulatory Visit
Admission: RE | Admit: 2021-04-30 | Discharge: 2021-04-30 | Disposition: A | Discharge status: Home / Self Care | Payer: Medicare Other | Source: Ambulatory Visit | Attending: Student | Admitting: Student

## 2021-04-30 ENCOUNTER — Other Ambulatory Visit: Payer: Self-pay

## 2021-04-30 DIAGNOSIS — M7989 Other specified soft tissue disorders: Secondary | ICD-10-CM

## 2021-10-10 ENCOUNTER — Other Ambulatory Visit: Payer: Self-pay | Admitting: Family Medicine

## 2021-10-10 DIAGNOSIS — G4452 New daily persistent headache (NDPH): Secondary | ICD-10-CM

## 2021-10-10 DIAGNOSIS — R519 Headache, unspecified: Secondary | ICD-10-CM

## 2021-10-21 ENCOUNTER — Ambulatory Visit: Payer: Medicare Other

## 2021-10-21 ENCOUNTER — Other Ambulatory Visit: Payer: Medicare Other

## 2021-10-22 ENCOUNTER — Ambulatory Visit (HOSPITAL_COMMUNITY)
Admission: RE | Admit: 2021-10-22 | Discharge: 2021-10-22 | Disposition: A | Discharge status: Home / Self Care | Payer: Medicare Other | Source: Ambulatory Visit | Attending: Family Medicine | Admitting: Family Medicine

## 2021-10-22 ENCOUNTER — Other Ambulatory Visit: Payer: Self-pay

## 2021-10-22 DIAGNOSIS — R519 Headache, unspecified: Secondary | ICD-10-CM | POA: Diagnosis present

## 2021-10-22 DIAGNOSIS — G4452 New daily persistent headache (NDPH): Secondary | ICD-10-CM | POA: Diagnosis present

## 2021-10-22 MED ORDER — GADOBUTROL 1 MMOL/ML IV SOLN
8.5000 mL | Freq: Once | INTRAVENOUS | Status: AC | PRN
  Administered 2021-10-22: 8.5 mL via INTRAVENOUS

## 2022-04-01 ENCOUNTER — Other Ambulatory Visit: Payer: Self-pay | Admitting: Family Medicine

## 2022-04-01 DIAGNOSIS — Z1231 Encounter for screening mammogram for malignant neoplasm of breast: Secondary | ICD-10-CM

## 2022-04-16 ENCOUNTER — Ambulatory Visit
Admission: RE | Admit: 2022-04-16 | Discharge: 2022-04-16 | Disposition: A | Discharge status: Home / Self Care | Payer: Medicare Other | Source: Ambulatory Visit | Attending: Family Medicine | Admitting: Family Medicine

## 2022-04-16 DIAGNOSIS — Z1231 Encounter for screening mammogram for malignant neoplasm of breast: Secondary | ICD-10-CM | POA: Insufficient documentation

## 2023-06-26 IMAGING — MG MM DIGITAL SCREENING BILAT W/ TOMO AND CAD
8 series · 8 of 24 positions shown · non-contrast
Comparison: Previous exam(s).

CLINICAL DATA: Screening.

EXAM:
DIGITAL SCREENING BILATERAL MAMMOGRAM WITH TOMOSYNTHESIS AND CAD
TECHNIQUE: Bilateral screening digital craniocaudal and mediolateral oblique
mammograms were obtained. Bilateral screening digital breast
tomosynthesis was performed. The images were evaluated with
computer-aided detection.

[L CC synth-2D]
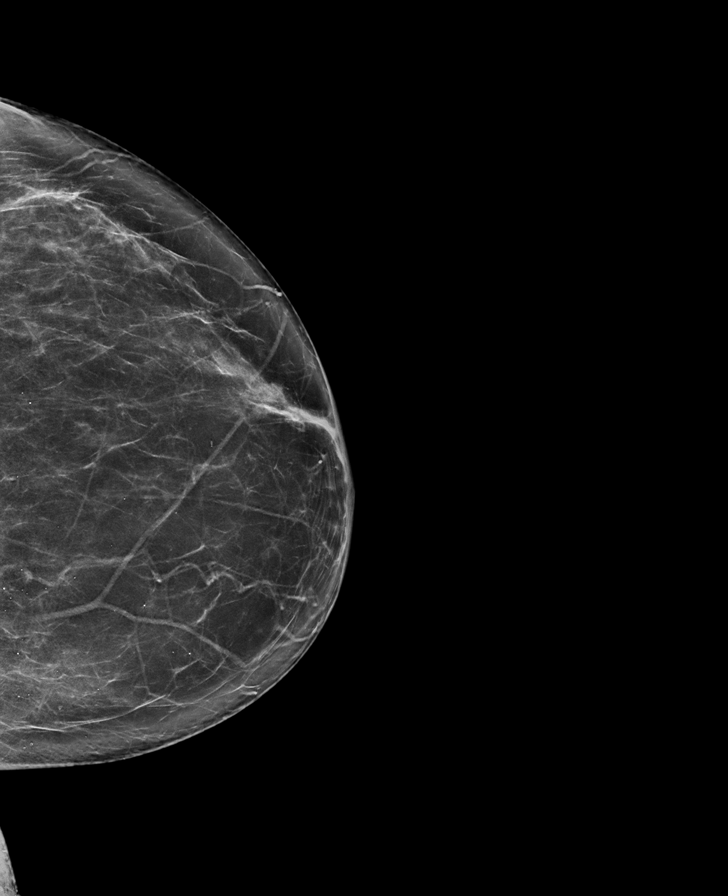

[L MLO synth-2D]
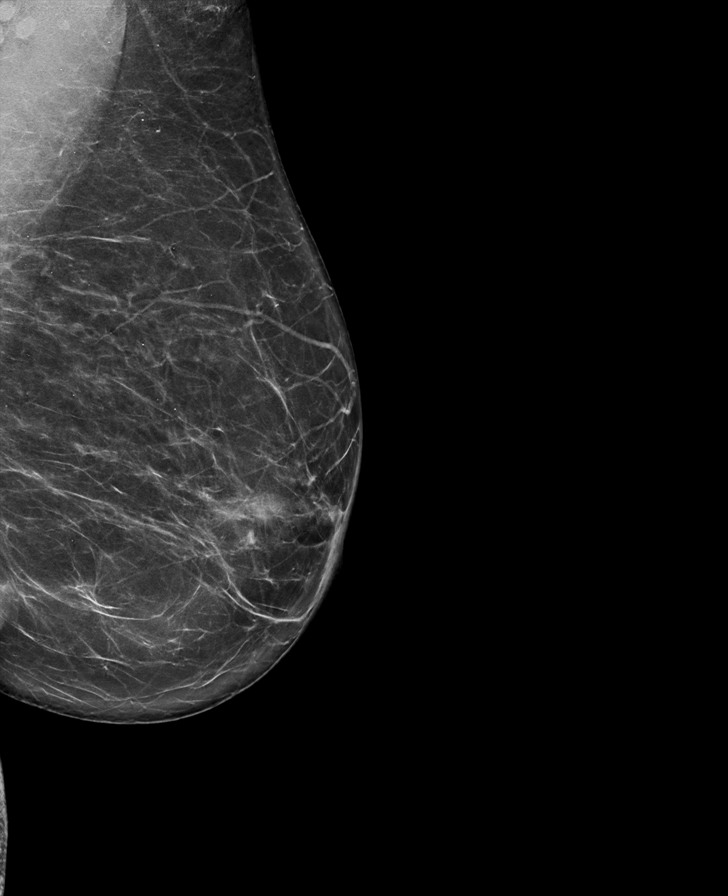

[R CC synth-2D]
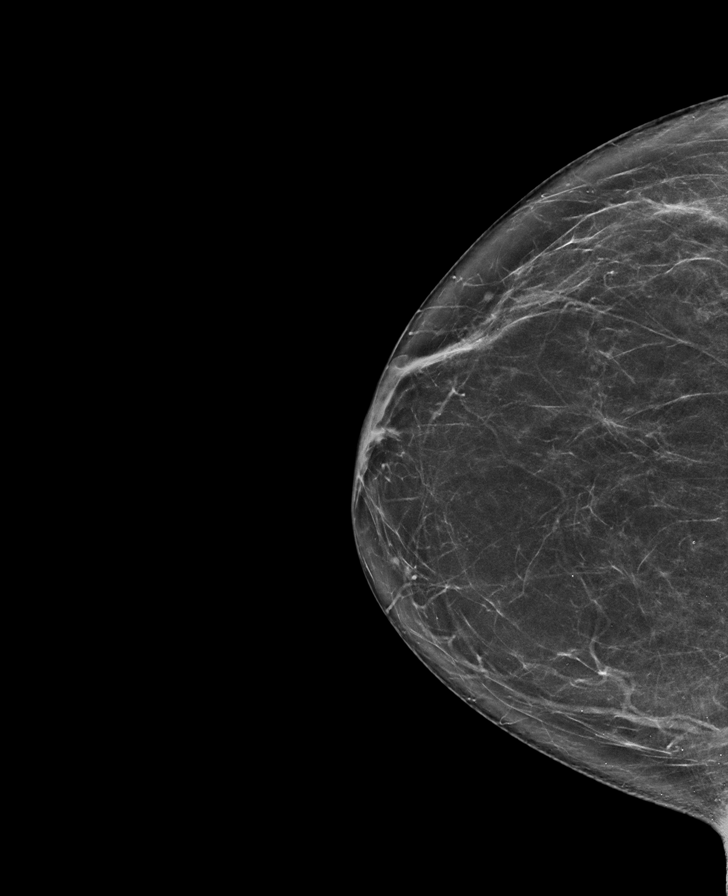

[R MLO synth-2D]
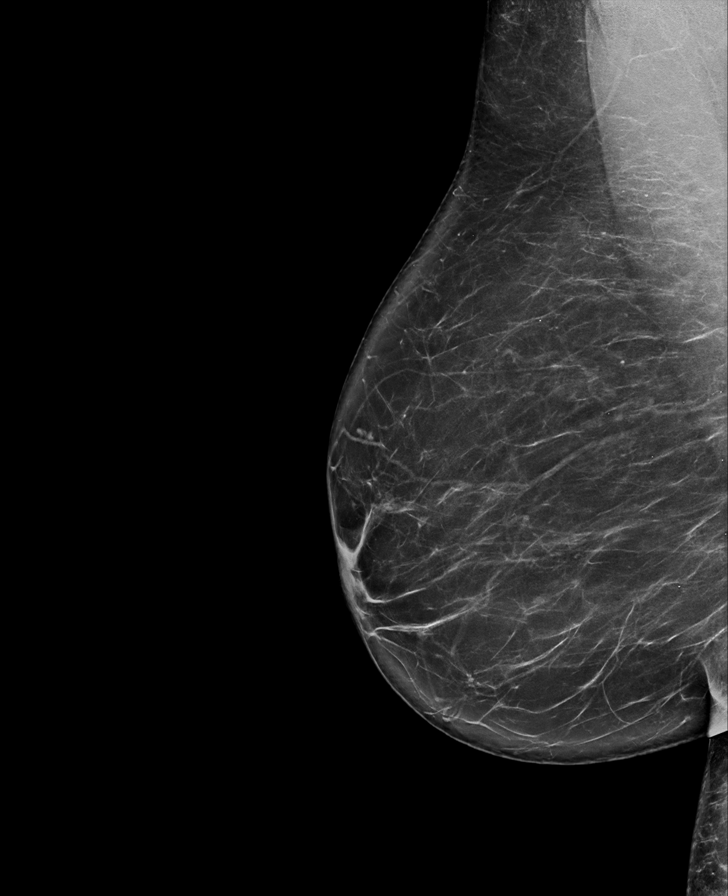

[R CC tomo · tomo slice 39/76.0]
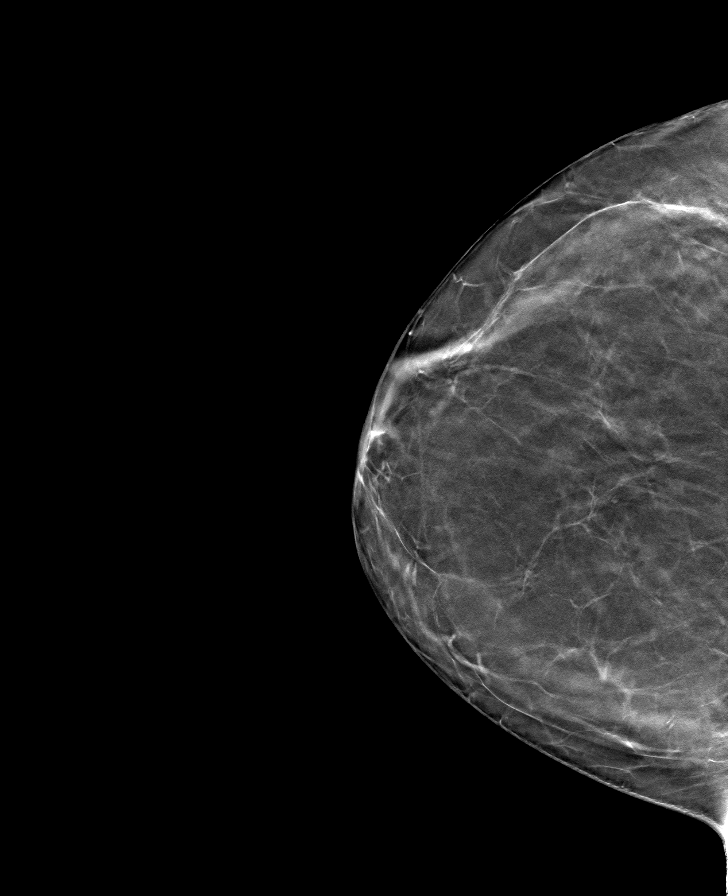

[R MLO tomo · tomo slice 45/89.0]
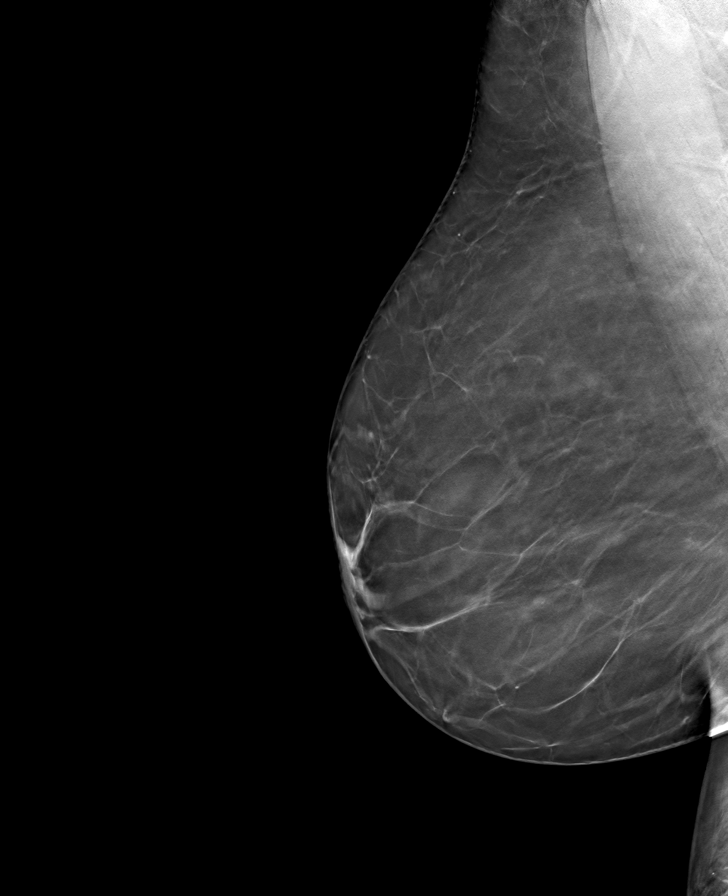

[L CC tomo · tomo slice 40/79.0]
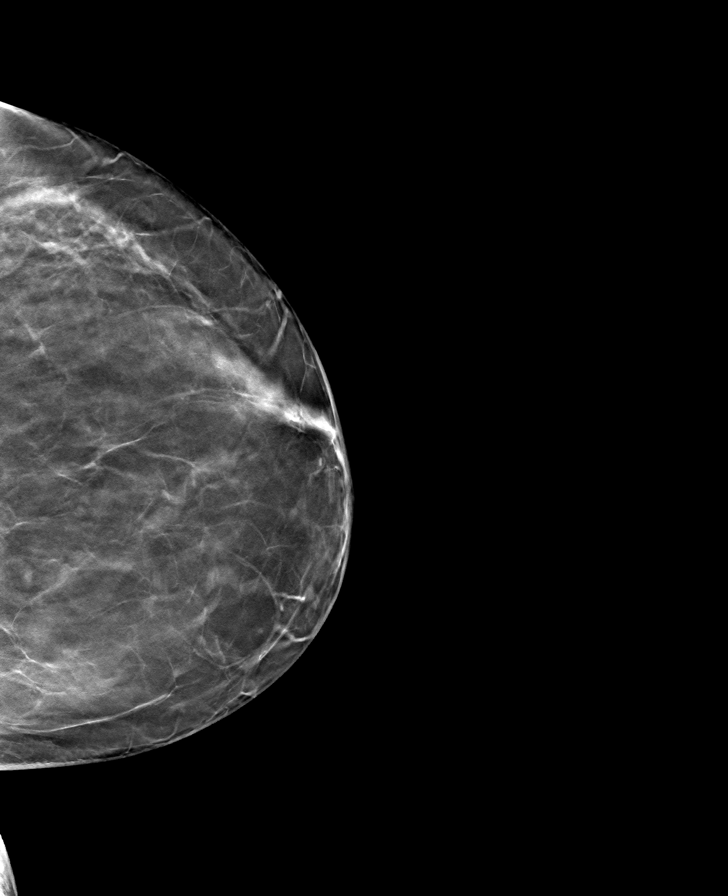

[L MLO tomo · tomo slice 41/81.0]
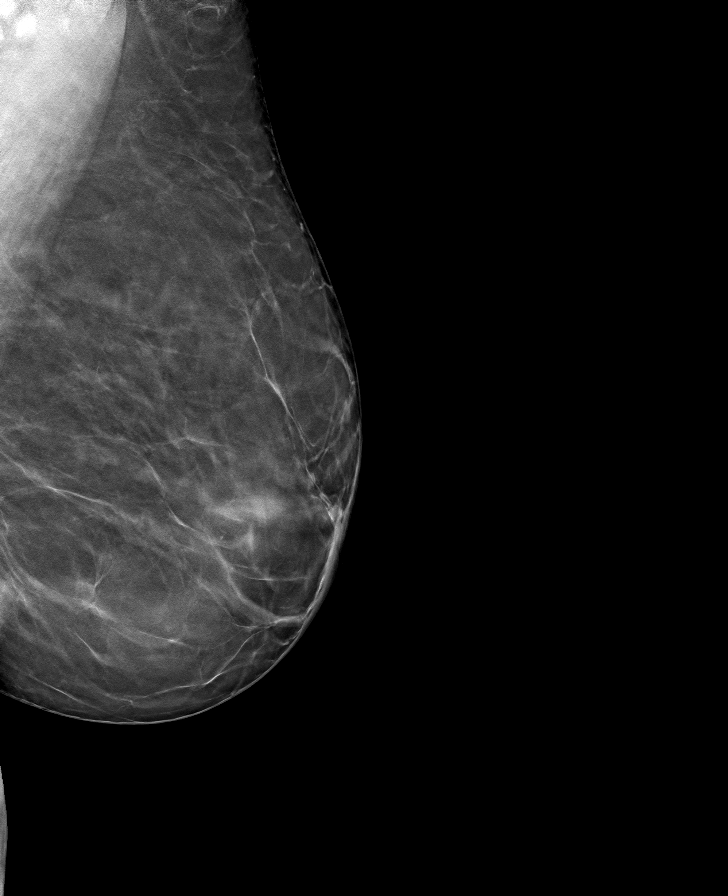

[8 of 24 positions shown; findings below may reference images not displayed]

ACR Breast Density Category b: There are scattered areas of
fibroglandular density.
FINDINGS: There are no findings suspicious for malignancy.
IMPRESSION: No mammographic evidence of malignancy. A result letter of this
screening mammogram will be mailed directly to the patient.

RECOMMENDATION:
Screening mammogram in one year. (Code:51-O-LD2)

BI-RADS CATEGORY  1: Negative.

## 2023-07-01 IMAGING — DX DG KNEE 1-2V PORT*L*
2 series · 2 of 2 positions shown · non-contrast
Comparison: None.

CLINICAL DATA: Postop left knee.

EXAM:
PORTABLE LEFT KNEE - 1-2 VIEW

[knee ap]
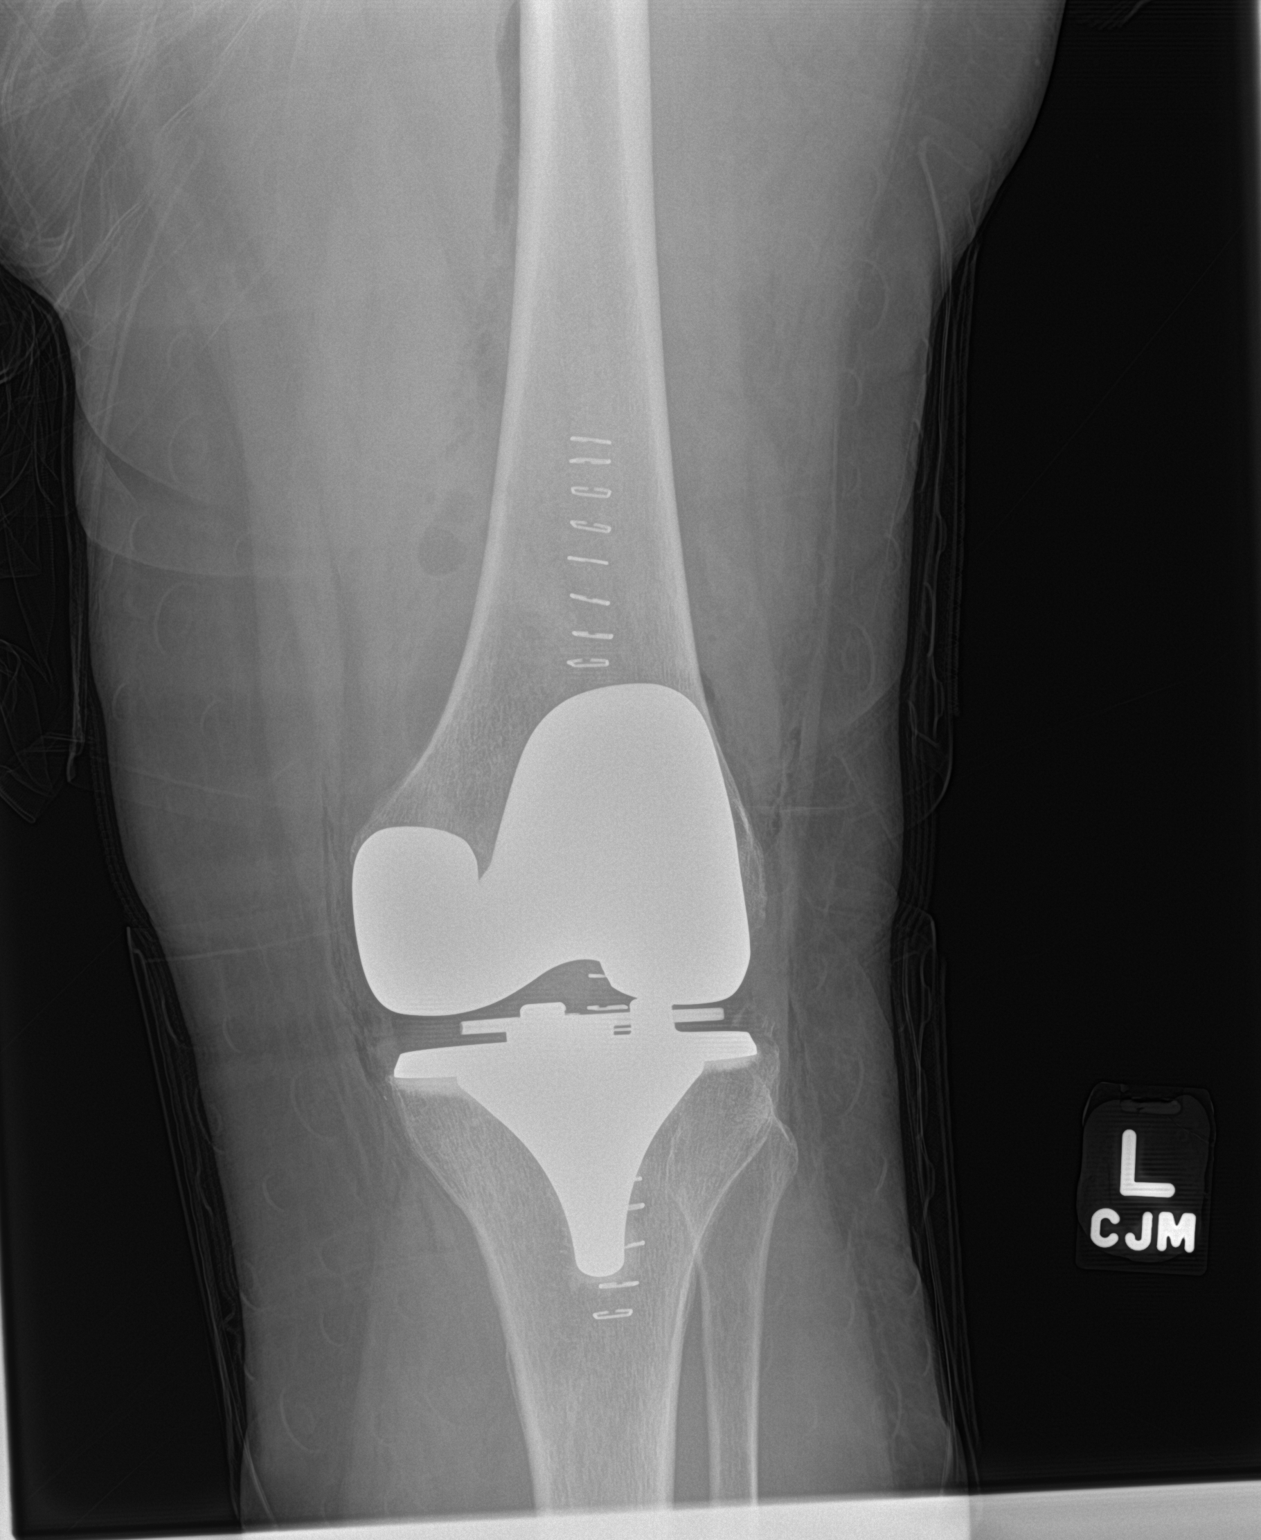

[knee lat]
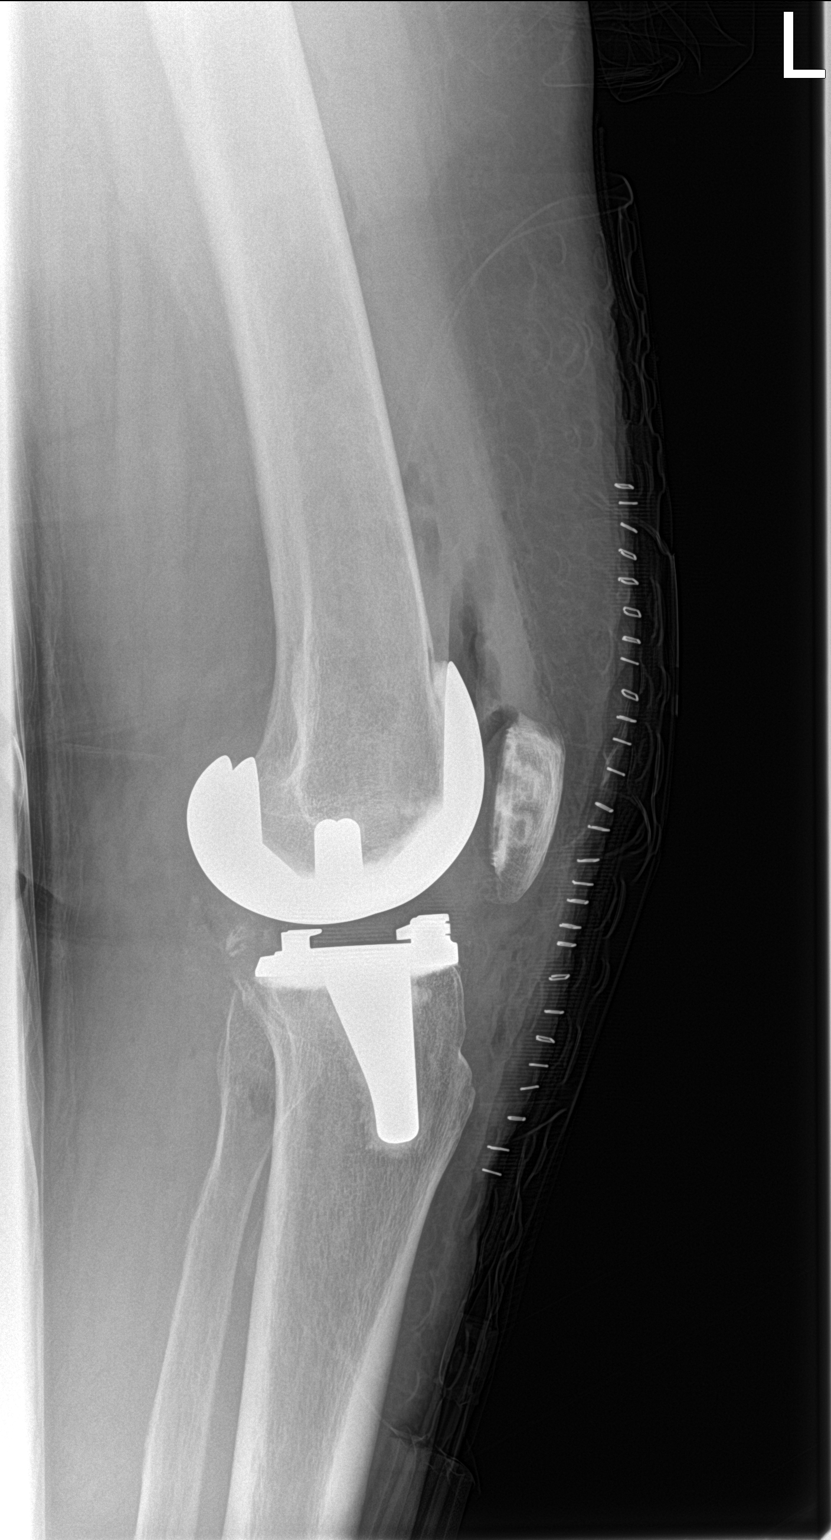

[2 of 2 positions shown; findings below may reference images not displayed]

FINDINGS: Left knee arthroplasty in expected alignment. No periprosthetic
lucency or fracture. There has been patellar resurfacing. Recent
postsurgical change includes air and edema in the soft tissues and
joint space. Anterior skin staples in place.
IMPRESSION: Left knee arthroplasty without immediate postoperative complication.

## 2023-07-19 ENCOUNTER — Other Ambulatory Visit: Payer: Self-pay | Admitting: Family Medicine

## 2023-07-19 DIAGNOSIS — Z1231 Encounter for screening mammogram for malignant neoplasm of breast: Secondary | ICD-10-CM

## 2023-08-27 ENCOUNTER — Emergency Department
Admission: EM | Admit: 2023-08-27 | Discharge: 2023-08-27 | Disposition: A | Discharge status: Home / Self Care | Payer: Medicare Other | Attending: Emergency Medicine | Admitting: Emergency Medicine

## 2023-08-27 ENCOUNTER — Other Ambulatory Visit: Payer: Self-pay

## 2023-08-27 ENCOUNTER — Emergency Department: Payer: Medicare Other

## 2023-08-27 DIAGNOSIS — S301XXA Contusion of abdominal wall, initial encounter: Secondary | ICD-10-CM | POA: Insufficient documentation

## 2023-08-27 DIAGNOSIS — W19XXXA Unspecified fall, initial encounter: Secondary | ICD-10-CM | POA: Diagnosis not present

## 2023-08-27 DIAGNOSIS — M545 Low back pain, unspecified: Secondary | ICD-10-CM | POA: Insufficient documentation

## 2023-08-27 DIAGNOSIS — Z96652 Presence of left artificial knee joint: Secondary | ICD-10-CM | POA: Insufficient documentation

## 2023-08-27 DIAGNOSIS — R519 Headache, unspecified: Secondary | ICD-10-CM | POA: Diagnosis present

## 2023-08-27 LAB — URINALYSIS, ROUTINE W REFLEX MICROSCOPIC
Bilirubin Urine: NEGATIVE
Glucose, UA: NEGATIVE mg/dL
Hgb urine dipstick: NEGATIVE
Ketones, ur: NEGATIVE mg/dL
Leukocytes,Ua: NEGATIVE
Nitrite: NEGATIVE
Protein, ur: NEGATIVE mg/dL
Specific Gravity, Urine: 1.01 (ref 1.005–1.030)
pH: 7 (ref 5.0–8.0)

## 2023-08-27 LAB — BASIC METABOLIC PANEL
Anion gap: 9 (ref 5–15)
BUN: 20 mg/dL (ref 8–23)
CO2: 26 mmol/L (ref 22–32)
Calcium: 9.2 mg/dL (ref 8.9–10.3)
Chloride: 103 mmol/L (ref 98–111)
Creatinine, Ser: 0.98 mg/dL (ref 0.44–1.00)
GFR, Estimated: >60 mL/min (ref >60)
Glucose, Bld: 91 mg/dL (ref 70–99)
Potassium: 3.4 mmol/L — ABNORMAL LOW (ref 3.5–5.1)
Sodium: 138 mmol/L (ref 135–145)

## 2023-08-27 LAB — CBC WITH DIFFERENTIAL/PLATELET
Abs Immature Granulocytes: 0.14 10*3/uL — ABNORMAL HIGH (ref 0.00–0.07)
Basophils Absolute: 0 10*3/uL (ref 0.0–0.1)
Basophils Relative: 0 %
Eosinophils Absolute: 0.1 10*3/uL (ref 0.0–0.5)
Eosinophils Relative: 0 %
HCT: 43.7 % (ref 36.0–46.0)
Hemoglobin: 14.3 g/dL (ref 12.0–15.0)
Immature Granulocytes: 1 %
Lymphocytes Relative: 30 %
Lymphs Abs: 3.6 10*3/uL (ref 0.7–4.0)
MCH: 29.2 pg (ref 26.0–34.0)
MCHC: 32.7 g/dL (ref 30.0–36.0)
MCV: 89.4 fL (ref 80.0–100.0)
Monocytes Absolute: 1.1 10*3/uL — ABNORMAL HIGH (ref 0.1–1.0)
Monocytes Relative: 9 %
Neutro Abs: 7 10*3/uL (ref 1.7–7.7)
Neutrophils Relative %: 60 %
Platelets: 362 10*3/uL (ref 150–400)
RBC: 4.89 MIL/uL (ref 3.87–5.11)
RDW: 12.4 % (ref 11.5–15.5)
WBC: 12 10*3/uL — ABNORMAL HIGH (ref 4.0–10.5)
nRBC: 0 % (ref 0.0–0.2)

## 2023-08-27 MED ORDER — ACETAMINOPHEN 325 MG PO TABS
650.0000 mg | ORAL_TABLET | Freq: Once | ORAL | Status: AC
  Administered 2023-08-27: 650 mg via ORAL
  Filled 2023-08-27: qty 2

## 2023-08-27 MED ORDER — LIDOCAINE 5 % EX PTCH: 1.0000 | MEDICATED_PATCH | Freq: Two times a day (BID) | CUTANEOUS | 0 refills | Status: AC

## 2023-08-27 MED ORDER — LIDOCAINE 5 % EX PTCH
1.0000 | MEDICATED_PATCH | CUTANEOUS | Status: DC
  Administered 2023-08-27: 1 via TRANSDERMAL
  Filled 2023-08-27: qty 1

## 2023-08-27 NOTE — Discharge Instructions (Signed)
Your CT scan does not reveal any abnormalities inside your abdomen.  You may use the Lidoderm patches to help with your pain.  Please return for any new, worsening, or change in symptoms or other concerns.  It was a pleasure caring for you today.

## 2023-08-27 NOTE — ED Provider Notes (Signed)
[facility] Provider Note    Event Date/Time   First MD Initiated Contact with Patient 08/27/23 1431     (approximate)   History   Back Pain   HPI  Brandi Castro is a 67 y.o. adult who presents today for evaluation of left flank pain.  Patient reports that 5 days ago she fell onto the counter pain to her left flank.  She reports that she went to urgent care and they discharged her with pain medicine.  She reports that some days she has no pain mother today she has worsening pain.  Her pain is worsened with palpation, movement, and coughing or laughing.  She reports that she has a bruise to the area.  She underwent x-ray imaging which was reportedly normal.  Patient Active Problem List   Diagnosis Date Noted   Status post total knee replacement using cement, left 04/15/2021          Physical Exam   Triage Vital Signs: ED Triage Vitals  Encounter Vitals Group     BP 08/27/23 1159 (!) 155/76     Systolic BP Percentile --      Diastolic BP Percentile --      Pulse Rate 08/27/23 1159 86     Resp 08/27/23 1159 20     Temp 08/27/23 1159 98 F (36.7 C)     Temp Source 08/27/23 1159 Oral     SpO2 08/27/23 1159 98 %     Weight --      Height --      Head Circumference --      Peak Flow --      Pain Score 08/27/23 1200 9     Pain Loc --      Pain Education --      Exclude from Growth Chart --     Most recent vital signs: Vitals:   08/27/23 1159 08/27/23 1609  BP: (!) 155/76 (!) 153/76  Pulse: 86 80  Resp: 20 18  Temp: 98 F (36.7 C) 98.2 F (36.8 C)  SpO2: 98% 100%    Physical Exam Vitals and nursing note reviewed.  Constitutional:      General: Awake and alert. No acute distress.    Appearance: Normal appearance. The patient is normal weight.  HENT:     Head: Normocephalic and atraumatic.     Mouth: Mucous membranes are moist.  Eyes:     General: PERRL. Normal EOMs        Right eye: No discharge.        Left eye: No discharge.      Conjunctiva/sclera: Conjunctivae normal.  Cardiovascular:     Rate and Rhythm: Normal rate and regular rhythm.     Pulses: Normal pulses.     Heart sounds: Normal heart sounds Pulmonary:     Effort: Pulmonary effort is normal. No respiratory distress.     Breath sounds: Normal breath sounds.  Abdominal:     Abdomen is soft. There is no abdominal tenderness. No rebound or guarding. No distention. Musculoskeletal:        General: No swelling. Normal range of motion.     Cervical back: Normal range of motion and neck supple.  Left lumbar paraspinal area with old appearing area of ecchymosis.  No swelling.  No crepitus. Skin:    General: Skin is warm and dry.     Capillary Refill: Capillary refill takes less than 2 seconds.     Findings: No  rash.  Neurological:     Mental Status: The patient is awake and alert.      ED Results / Procedures / Treatments   Labs (all labs ordered are listed, but only abnormal results are displayed) Labs Reviewed  BASIC METABOLIC PANEL - Abnormal; Notable for the following components:      Result Value   Potassium 3.4 (*)    All other components within normal limits  CBC WITH DIFFERENTIAL/PLATELET - Abnormal; Notable for the following components:   WBC 12.0 (*)    Monocytes Absolute 1.1 (*)    Abs Immature Granulocytes 0.14 (*)    All other components within normal limits  URINALYSIS, ROUTINE W REFLEX MICROSCOPIC - Abnormal; Notable for the following components:   Color, Urine YELLOW (*)    APPearance CLEAR (*)    All other components within normal limits     EKG     RADIOLOGY I independently reviewed and interpreted imaging and agree with radiologists findings.     PROCEDURES:  Critical Care performed:   Procedures   MEDICATIONS ORDERED IN ED: Medications  lidocaine (LIDODERM) 5 % 1 patch (1 patch Transdermal Patch Applied 08/27/23 1501)  acetaminophen (TYLENOL) tablet 650 mg (650 mg Oral Given 08/27/23 1501)      IMPRESSION / MDM / ASSESSMENT AND PLAN / ED COURSE  I reviewed the triage vital signs and the nursing notes.   Differential diagnosis includes, but is not limited to, contusion, fracture, less likely visceral organ injury.  Patient is awake and alert, hemodynamically stable and afebrile.  She has normal oxygen saturation on room air and demonstrates no increased work of breathing. I reviewed the patient's chart.  Patient was seen at [facility] on 08/23/2023 at which time she had x-ray of ribs and chest and x-ray lumbar spine which were normal.  She also had a urinalysis which reportedly showed evidence of infection and she was discharged on cefdinir.  She was also given a prescription for Norco.  Patient has an area of ecchymosis to her left flank.  She has no abdominal tenderness.  Denies hematuria, urinalysis does not reveal evidence of blood.  Stable H&H.  CT does not reveal any evidence of visceral organ injury or intra-abdominal abnormalities.  She is hemodynamically stable, do not suspect a slow visceral organ bleed.  Patient was treated symptomatically with Tylenol and Lidoderm patch and reports significant improvement with the Lidoderm patches.  She is requesting a prescription for these which was provided.  She is also requesting a work note.  We discussed return precautions and outpatient follow-up.  Patient or stands and agrees with plan.  She was discharged in stable condition.   Patient's presentation is most consistent with acute complicated illness / injury requiring diagnostic workup.    FINAL CLINICAL IMPRESSION(S) / ED DIAGNOSES   Final diagnoses:  Acute left-sided low back pain without sciatica  Contusion of flank, initial encounter     Rx / DC Orders   ED Discharge Orders          Ordered    lidocaine (LIDODERM) 5 %  Every 12 hours        08/27/23 1640             Note:  This document was prepared using Dragon voice recognition software and may  include unintentional dictation errors.   Jackelyn Hoehn, PA-C 08/27/23 1805    Shaune Pollack, MD 08/28/23 [phone]

## 2023-08-27 NOTE — ED Notes (Signed)
See triage notes. Patient c/o low back pain with no improvements.

## 2023-08-27 NOTE — ED Triage Notes (Signed)
First nurse note: Pt to ED via [facility]. Pt was seen for continued low back pain with no improvement from medications.

## 2023-11-24 ENCOUNTER — Ambulatory Visit
Admission: RE | Admit: 2023-11-24 | Discharge: 2023-11-24 | Disposition: A | Discharge status: Home / Self Care | Source: Ambulatory Visit | Attending: Family Medicine | Admitting: Family Medicine

## 2023-11-24 DIAGNOSIS — Z1231 Encounter for screening mammogram for malignant neoplasm of breast: Secondary | ICD-10-CM | POA: Insufficient documentation

## 2024-01-24 ENCOUNTER — Emergency Department
Admission: EM | Admit: 2024-01-24 | Discharge: 2024-01-25 | Disposition: A | Discharge status: Home / Self Care | Attending: Emergency Medicine | Admitting: Emergency Medicine

## 2024-01-24 ENCOUNTER — Emergency Department

## 2024-01-24 ENCOUNTER — Other Ambulatory Visit: Payer: Self-pay

## 2024-01-24 DIAGNOSIS — S0181XA Laceration without foreign body of other part of head, initial encounter: Secondary | ICD-10-CM

## 2024-01-24 DIAGNOSIS — W01198A Fall on same level from slipping, tripping and stumbling with subsequent striking against other object, initial encounter: Secondary | ICD-10-CM | POA: Insufficient documentation

## 2024-01-24 DIAGNOSIS — S0990XA Unspecified injury of head, initial encounter: Secondary | ICD-10-CM | POA: Diagnosis not present

## 2024-01-24 DIAGNOSIS — S01111A Laceration without foreign body of right eyelid and periocular area, initial encounter: Secondary | ICD-10-CM | POA: Insufficient documentation

## 2024-01-24 DIAGNOSIS — S0993XA Unspecified injury of face, initial encounter: Secondary | ICD-10-CM | POA: Diagnosis present

## 2024-01-24 MED ORDER — LIDOCAINE-EPINEPHRINE (PF) 2 %-1:200000 IJ SOLN
10.0000 mL | Freq: Once | INTRAMUSCULAR | Status: AC
  Administered 2024-01-25: 10 mL
  Filled 2024-01-24: qty 20

## 2024-01-24 NOTE — ED Triage Notes (Addendum)
 Pt reports she tripped and fell face first into her night stand. Pt has laceration under right eyebrow and abrasion to nose. Pt c/o pain to right side of face, swelling to nose noted. Pt states she has not taken her blood thinner in aprox 1 week. Denies LOC, numbness or weakness.

## 2024-01-25 ENCOUNTER — Emergency Department

## 2024-01-25 DIAGNOSIS — S01111A Laceration without foreign body of right eyelid and periocular area, initial encounter: Secondary | ICD-10-CM | POA: Diagnosis not present

## 2024-01-25 MED ORDER — HYDROCODONE-ACETAMINOPHEN 5-325 MG PO TABS
1.0000 | ORAL_TABLET | Freq: Once | ORAL | Status: AC
  Administered 2024-01-25: 1 via ORAL
  Filled 2024-01-25: qty 1

## 2024-01-25 MED ORDER — HYDROCODONE-ACETAMINOPHEN 5-325 MG PO TABS: 1.0000 | ORAL_TABLET | Freq: Four times a day (QID) | ORAL | 0 refills | Status: AC | PRN

## 2024-01-25 NOTE — ED Provider Notes (Signed)
 [facility] Provider Note    Event Date/Time   First MD Initiated Contact with Patient 01/24/24 2320     (approximate)   History   Head Injury   HPI  Brandi Castro is a 68 year old female presenting to the emergency department for evaluation of head injury.  Patient was walking when she tripped and hit her face on her nightstand.  Sustained a laceration under her right eyebrow.  Has pain over the right side of her face and her nose.  No LOC.  No numbness, tingling, focal weakness.  Normally takes Plavix, but ran out of her prescription so she has not taken it in the last week.  Denies injury to other areas.  Tetanus up-to-date.     Physical Exam   Triage Vital Signs: ED Triage Vitals  Encounter Vitals Group     BP 01/24/24 2130 (!) 145/82     Systolic BP Percentile --      Diastolic BP Percentile --      Pulse Rate 01/24/24 2130 77     Resp 01/24/24 2130 18     Temp 01/24/24 2130 99 F (37.2 C)     Temp Source 01/25/24 0030 Oral     SpO2 01/24/24 2130 100 %     Weight 01/24/24 2128 169 lb (76.7 kg)     Height 01/24/24 2128 5\' 3"  (1.6 m)     Head Circumference --      Peak Flow --      Pain Score 01/24/24 2128 10     Pain Loc --      Pain Education --      Exclude from Growth Chart --     Most recent vital signs: Vitals:   01/24/24 2130 01/25/24 0030  BP: (!) 145/82 (!) 132/92  Pulse: 77 70  Resp: 18 16  Temp: 99 F (37.2 C) 98.6 F (37 C)  SpO2: 100% 97%     Nursing notes and vital signs reviewed.  General: Adult female, laying in bed, awake interactive Head: There is a 3 cm laceration along the right eyebrow with associated gaping.  Ongoing nonpulsatile bleeding noted.  There is ecchymosis along the right side of the face extending to the bridge of the nose with mild swelling.  No proptosis, normal extraocular movements. Back: Pain over the posterior neck without focal area of point tenderness Chest: Symmetric chest rise, no  tenderness to palpation.  Cardiac: Regular rhythm and rate.  Respiratory: Lungs clear to auscultation Abdomen: Soft, nondistended. No tenderness to palpation.  Pelvis: Stable in AP and lateral compression. No tenderness to palpation. MSK: No deformity to bilateral upper and lower extremity. Full range of motion to bilateral upper lower extremity. Neuro: Alert, oriented. GCS 15. 5 out of 5 strength in bilateral upper and lower extremities. Normal sensation to light touch in bilateral upper and lower extremity. Skin: No evidence of burns   ED Results / Procedures / Treatments   Labs (all labs ordered are listed, but only abnormal results are displayed) Labs Reviewed - No data to display   EKG EKG independently reviewed interpreted by myself (ER attending) demonstrates:    RADIOLOGY Imaging independently reviewed and interpreted by myself demonstrates:  CT head without acute bleed CT max face without acute fracture, radiology does note soft tissue swelling along the bridge of the nose and lower forehead CT C-spine without acute fracture  Formal Radiology Read:  CT Cervical Spine Wo Contrast Result Date: 01/25/2024 CLINICAL  DATA:  Fall, hit face EXAM: CT CERVICAL SPINE WITHOUT CONTRAST TECHNIQUE: Multidetector CT imaging of the cervical spine was performed without intravenous contrast. Multiplanar CT image reconstructions were also generated. RADIATION DOSE REDUCTION: This exam was performed according to the departmental dose-optimization program which includes automated exposure control, adjustment of the mA and/or kV according to patient size and/or use of iterative reconstruction technique. COMPARISON:  None Available. FINDINGS: Alignment: Normal Skull base and vertebrae: No acute fracture. No primary bone lesion or focal pathologic process. Soft tissues and spinal canal: No prevertebral fluid or swelling. No visible canal hematoma. Disc levels: Mild degenerative disc disease with disc  space narrowing and spurring most notable at C5-6 and C6-7. Mild bilateral degenerative facet disease. Multilevel bilateral neural foraminal narrowing. Upper chest: No acute findings Other: None IMPRESSION: Degenerative disc and facet disease.  No acute bony abnormality. Electronically Signed   By: Janeece Mechanic M.D.   On: 01/25/2024 01:17   CT Maxillofacial Wo Contrast Result Date: 01/24/2024 CLINICAL DATA:  Fall, hit face. EXAM: CT MAXILLOFACIAL WITHOUT CONTRAST TECHNIQUE: Multidetector CT imaging of the maxillofacial structures was performed. Multiplanar CT image reconstructions were also generated. RADIATION DOSE REDUCTION: This exam was performed according to the departmental dose-optimization program which includes automated exposure control, adjustment of the mA and/or kV according to patient size and/or use of iterative reconstruction technique. COMPARISON:  None Available. FINDINGS: Osseous: No fracture or mandibular dislocation. No destructive process. Orbits: Negative. No traumatic or inflammatory finding. Sinuses: Clear Soft tissues: Soft tissue swelling over the bridge of the nose and the lower forehead. Limited intracranial: See head CT report IMPRESSION: No evidence of facial or orbital fracture. Electronically Signed   By: Janeece Mechanic M.D.   On: 01/24/2024 22:35   CT HEAD WO CONTRAST ( ) Result Date: 01/24/2024 CLINICAL DATA:  Fall, on Plavix EXAM: CT HEAD WITHOUT CONTRAST TECHNIQUE: Contiguous axial images were obtained from the base of the skull through the vertex without intravenous contrast. RADIATION DOSE REDUCTION: This exam was performed according to the departmental dose-optimization program which includes automated exposure control, adjustment of the mA and/or kV according to patient size and/or use of iterative reconstruction technique. COMPARISON:  None Available. FINDINGS: Brain: No acute intracranial abnormality. Specifically, no hemorrhage, hydrocephalus, mass lesion, acute  infarction, or significant intracranial injury. Vascular: No hyperdense vessel or unexpected calcification. Skull: No acute calvarial abnormality. Sinuses/Orbits: No acute findings Other: None IMPRESSION: No acute intracranial abnormality. Electronically Signed   By: Janeece Mechanic M.D.   On: 01/24/2024 22:33    PROCEDURES:  Critical Care performed: No  .Laceration Repair  Date/Time: 01/25/2024 1:29 AM  Performed by: Claria Crofts, MD Authorized by: Claria Crofts, MD   Consent:    Consent obtained:  Verbal   Consent given by:  Patient   Risks, benefits, and alternatives were discussed: yes   Anesthesia:    Anesthesia method:  Local infiltration   Local anesthetic:  Lidocaine  2% WITH epi Laceration details:    Location:  Face   Face location:  R eyebrow   Length (cm):  3 Treatment:    Area cleansed with:  Saline   Amount of cleaning:  Standard   Irrigation solution:  Sterile saline   Irrigation method:  Syringe Skin repair:    Repair method:  Sutures   Suture size:  6-0   Suture material:  Prolene   Suture technique:  Simple interrupted   Number of sutures:  3 Approximation:    Approximation:  Close Repair type:  Repair type:  Simple Post-procedure details:    Dressing:  Open (no dressing)   Procedure completion:  Tolerated    MEDICATIONS ORDERED IN ED: Medications  lidocaine -EPINEPHrine  (XYLOCAINE  W/EPI) 2 %-1:200000 (PF) injection 10 mL (10 mLs Infiltration Given by Other 01/25/24 0044)  HYDROcodone-acetaminophen  (NORCO/VICODIN) 5-325 MG per tablet 1 tablet (1 tablet Oral Given 01/25/24 0133)     IMPRESSION / MDM / ASSESSMENT AND PLAN / ED COURSE  I reviewed the triage vital signs and the nursing notes.  Differential diagnosis includes, but is not limited to, intracranial bleed, skull fracture, spine fracture, no evidence of thoracoabdominal trauma, facial laceration  Patient's presentation is most consistent with acute presentation with potential threat to life or  bodily function.  68 year old female presenting with head trauma.  CT head, C-spine, face fortunately without significant traumatic injury.  Patient's laceration was repaired as above.  Tetanus up-to-date.  Patient reports ongoing headache, denies other complaints on reevaluation.  Will DC with very short course of pain medication.  She is comfortable discharge home.  Strict return precautions provided.     FINAL CLINICAL IMPRESSION(S) / ED DIAGNOSES   Final diagnoses:  Closed head injury, initial encounter  Facial laceration, initial encounter     Rx / DC Orders   ED Discharge Orders          Ordered    HYDROcodone-acetaminophen  (NORCO/VICODIN) 5-325 MG tablet  Every 6 hours PRN        01/25/24 0134             Note:  This document was prepared using Dragon voice recognition software and may include unintentional dictation errors.   Claria Crofts, MD 01/25/24 [phone]

## 2024-01-25 NOTE — Discharge Instructions (Signed)
 You were seen in the emergency department today after a head injury. Fortunately, your exam and CT scan were overall reassuring. It is still possible that you have a concussion. I have included more information about this in your paperwork. Please follow-up with your primary care doctor within a few days for reevaluation if you are not feeling improved. Return to the ER for any worsening symptoms including worsening headache, confusion, or any other new or concerning symptoms.   Please have your sutures removed from your face in 5 days.  You can go to your primary care doctor, urgent care, or return to the ER.  I sent a short course of pain medication to your pharmacy.  This can make you drowsy, do not drive or operate machinery when taking this.  Return to the ER for new or worsening symptoms.

## 2024-01-25 NOTE — ED Notes (Signed)
 Pt to radiology at this time.

## 2024-01-25 NOTE — ED Notes (Signed)
 Pt ambulated to restroom with daughter as stand-by assist. Gait steady

## 2024-02-02 ENCOUNTER — Emergency Department
Admission: EM | Admit: 2024-02-02 | Discharge: 2024-02-02 | Disposition: A | Discharge status: Home / Self Care | Attending: Emergency Medicine | Admitting: Emergency Medicine

## 2024-02-02 ENCOUNTER — Other Ambulatory Visit: Payer: Self-pay

## 2024-02-02 ENCOUNTER — Emergency Department

## 2024-02-02 DIAGNOSIS — R519 Headache, unspecified: Secondary | ICD-10-CM | POA: Diagnosis present

## 2024-02-02 DIAGNOSIS — F0781 Postconcussional syndrome: Secondary | ICD-10-CM | POA: Insufficient documentation

## 2024-02-02 DIAGNOSIS — G44309 Post-traumatic headache, unspecified, not intractable: Secondary | ICD-10-CM | POA: Insufficient documentation

## 2024-02-02 DIAGNOSIS — I10 Essential (primary) hypertension: Secondary | ICD-10-CM | POA: Insufficient documentation

## 2024-02-02 NOTE — ED Triage Notes (Signed)
 Pt to ED via POV from home. Pt report seen on 5/19 after a head injury. Pt had stitches placed and needs them removed. Pt reports increased HA and intermittent blurry vision when she is trying to read. Pt has been having some numbness across her nose and side of cheek to temple area. Pt has healing bruises to bilateral eyes. Pt has stitches to right eyebrow.   Hx of stroke. Pt is on Plavix.

## 2024-02-02 NOTE — ED Provider Notes (Signed)
 [facility] Provider Note    Event Date/Time   First MD Initiated Contact with Patient 02/02/24 1417     (approximate)   History   Headache and Suture / Staple Removal   HPI  Brandi Castro is a 68 y.o. adult with PMH of hypertension, sleep apnea, depression, fibromyalgia, depression and cancer who presents for suture removal but is also having increased headaches.  Patient reports that she has also had some intermittent blurry vision, fatigue and difficult sleeping.  No nausea or vomiting.      Physical Exam   Triage Vital Signs: ED Triage Vitals  Encounter Vitals Group     BP 02/02/24 1400 (!) 145/65     Systolic BP Percentile --      Diastolic BP Percentile --      Pulse Rate 02/02/24 1400 62     Resp 02/02/24 1400 20     Temp 02/02/24 1400 98.7 F (37.1 C)     Temp Source 02/02/24 1400 Oral     SpO2 02/02/24 1400 98 %     Weight --      Height --      Head Circumference --      Peak Flow --      Pain Score 02/02/24 1401 8     Pain Loc --      Pain Education --      Exclude from Growth Chart --     Most recent vital signs: Vitals:   02/02/24 1400  BP: (!) 145/65  Pulse: 62  Resp: 20  Temp: 98.7 F (37.1 C)  SpO2: 98%   General: Awake, no distress.  CV:  Good peripheral perfusion.  Resp:  Normal effort.  Abd:  No distention.  Other:  Well-healed laceration to the right eyebrow.  Resolving ecchymosis beneath bilateral eyes.  No focal neurodeficits.  PERRL, EOM intact.  No ataxia.  No drift.   ED Results / Procedures / Treatments   Labs (all labs ordered are listed, but only abnormal results are displayed) Labs Reviewed - No data to display  RADIOLOGY  CT head pending.    PROCEDURES:  Critical Care performed: No  Procedures   MEDICATIONS ORDERED IN ED: Medications - No data to display   IMPRESSION / MDM / ASSESSMENT AND PLAN / ED COURSE  I reviewed the triage vital signs and the nursing notes.                              68 year old female presents for evaluation of suture removal and ongoing headache.  Blood pressure slightly elevated otherwise vital signs are stable.  Patient NAD on exam.  Differential diagnosis includes, but is not limited to, concussion, intracranial bleed, tension headache, Suture removal.  Patient's presentation is most consistent with acute complicated illness / injury requiring diagnostic workup.  Laceration to the right eyebrow is well-healed and sutures were removed.  Will obtain CT head given patient's report of increasing headache symptoms.  Suspect that the ongoing symptoms of a concussion from her initial fall but need to rule out intracranial bleed given age and blood thinner usage.  If CT imaging is negative and feel that patient would be stable for outpatient management.  We discussed following up with neurology who she already has an established relationship with due to her fibromyalgia if her headache symptoms continue past 2 weeks.  Offered patient pain medication but she declined  stating she recently took ibuprofen and was comfortable at this time.  Care of patient will be passed to the oncoming provider.     FINAL CLINICAL IMPRESSION(S) / ED DIAGNOSES   Final diagnoses:  None     Rx / DC Orders   ED Discharge Orders     None        Note:  This document was prepared using Dragon voice recognition software and may include unintentional dictation errors.   Phyliss Breen, PA-C 02/02/24 1503    Bryson Carbine, MD 02/02/24 [id number]

## 2024-02-02 NOTE — ED Triage Notes (Signed)
 First nurse: Arrived from [facility] for headache that is progressively getting worse. Pressure in frontal side. Reports new numbness around left eye and nose. Reports vision changes. Reports all these symptoms have been going on since 01/24/24.  Takes plavix. History stroke   Seen at [facility] ED on 01/24/24 for head injury

## 2024-02-02 NOTE — Discharge Instructions (Addendum)
 I believe your headaches are the result of a concussion that you sustained during your fall last week.  Follow-up with your primary care physician and your neurologist as scheduled.  Return to the emergency department for any worsening symptoms.  Pain control:  Ibuprofen (motrin/aleve/advil) - You can take 3 tablets (600 mg) every 6 hours as needed for pain/fever.  Acetaminophen  (tylenol ) - You can take 2 extra strength tablets (1000 mg) every 6 hours as needed for pain/fever.  You can alternate these medications or take them together.  Make sure you eat food/drink water when taking these medications.

## 2024-02-02 NOTE — ED Provider Notes (Signed)
 Care assumed of patient from outgoing provider.  See their note for initial history, exam and plan.  Clinical Course as of 02/02/24 1626  Wed Feb 02, 2024  1509 Fall 1 week ago - concern for concussion.  CT head pending.  S [SM]    Clinical Course User Index [SM] Viviano Ground, MD  CT scan of the head read as no acute findings.  Patient's clinical picture concerning for postconcussive syndrome.  Discussed close follow-up with primary care physician and discussed return precautions.   Viviano Ground, MD 02/02/24 1626

## 2024-08-17 ENCOUNTER — Ambulatory Visit
Admission: RE | Admit: 2024-08-17 | Discharge: 2024-08-17 | Disposition: A | Discharge status: Home / Self Care | Source: Ambulatory Visit

## 2024-08-17 VITALS — BP 140/81 | HR 80 | Temp 98.5°F | Resp 16

## 2024-08-17 DIAGNOSIS — J22 Unspecified acute lower respiratory infection: Secondary | ICD-10-CM

## 2024-08-17 DIAGNOSIS — J40 Bronchitis, not specified as acute or chronic: Secondary | ICD-10-CM

## 2024-08-17 DIAGNOSIS — J3489 Other specified disorders of nose and nasal sinuses: Secondary | ICD-10-CM

## 2024-08-17 DIAGNOSIS — R051 Acute cough: Secondary | ICD-10-CM

## 2024-08-17 MED ORDER — PROMETHAZINE-DM 6.25-15 MG/5ML PO SYRP: 5.0000 mL | ORAL_SOLUTION | Freq: Four times a day (QID) | ORAL | 0 refills | Status: AC | PRN

## 2024-08-17 MED ORDER — DOXYCYCLINE HYCLATE 100 MG PO CAPS: 100.0000 mg | ORAL_CAPSULE | Freq: Two times a day (BID) | ORAL | 0 refills | Status: AC

## 2024-08-17 NOTE — ED Triage Notes (Signed)
 Patient presents to UC for cough and SOB x 11 days. Treating with OTC cough med. No hx of lung problems.   Denies fever.

## 2024-08-17 NOTE — Discharge Instructions (Addendum)
 Take doxycycline as prescribed Take tessalon as prescribed while at work, phenergan dm in evening Drink plenty of fluids Follow up with PCP If you have new or worsening symptoms(chest pain, palpitations, worsening shortness of breath, etc.) go to the emergency room for further evaluation

## 2024-08-17 NOTE — ED Provider Notes (Signed)
 [facility]    CSN: [id number] Arrival date & time: 08/17/24  1026      History   Chief Complaint Chief Complaint  Patient presents with   Cough    11 days of chest congestion. - Entered by patient    HPI Brandi Castro is a 68 y.o. adult.   68 year old female, Brandi Castro, presents to urgent care for evaluation of cough and shortness of breath for 11 days, patient reports postnasal drainage.  Patient has been taken over-the-counter cough med without relief.  Patient states she works at [employer]  and symptoms started at [facility] Friday. Symptoms are worse in evening,night time.   The history is provided by the patient. No language interpreter was used.  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      Home Medications    Prior to Admission medications  Medication Sig Start Date End Date Taking? Authorizing Provider  benzonatate (TESSALON) 100 MG capsule Take 1 capsule (100 mg total) by mouth every 8 (eight) hours. 08/17/24  Yes Bryndle Corredor, NP   butalbital-acetaminophen -caffeine (FIORICET) 50-325-40 MG tablet Take 1 tablet at headache onset, can repeat after 4 hours. No more than 2 pills in 24 hours. Do not take more than 2-3 times a week MAXIMUM. 02/03/23  Yes [provider]  Cholecalciferol (VITAMIN D-1000 MAX ST) 25 MCG (1000 UT) tablet Take 1,000 Units by mouth. 01/11/23  Yes [provider]  doxycycline (VIBRAMYCIN) 100 MG capsule Take 1 capsule (100 mg total) by mouth 2 (two) times daily for 7 days. 08/17/24 08/24/24 Yes Zelma Snead, NP  DULoxetine (CYMBALTA) 30 MG capsule Take 30 mg by mouth daily. 06/19/24  Yes [provider]  promethazine-dextromethorphan (PROMETHAZINE-DM) 6.25-15 MG/5ML syrup Take 5 mLs by mouth 4 (four) times daily as needed for cough. 08/17/24  Yes Davian Wollenberg, Rilla, NP  acetaminophen  (TYLENOL ) 650 MG CR tablet Take 1,300 mg by mouth daily as needed for pain.    [provider]  amLODipine  (NORVASC ) 10 MG tablet Take 10 mg by mouth at bedtime. 01/01/21   [provider]  apixaban  (ELIQUIS ) 2.5 MG TABS tablet Take 1 tablet (2.5 mg total) by mouth 2 (two) times daily. 04/16/21   Kip Lynwood Double, PA-C  buPROPion  (WELLBUTRIN  XL) 150 MG 24 hr tablet Take 300 mg by mouth daily. 03/13/21   [provider]  celecoxib (CELEBREX) 200 MG capsule Take  200 mg by mouth in the morning and at bedtime. 03/20/21   [provider]  clopidogrel (PLAVIX) 75 MG tablet SMARTSIG:1 Tablet(s) By Mouth 6 Times a Week    [provider]  cyanocobalamin (VITAMIN B12) 1000 MCG tablet Take 1,000 mcg by mouth.    [provider]  enalapril  (VASOTEC ) 5 MG tablet Take 5 mg by mouth at bedtime. 01/01/21   [provider]  fluticasone  (FLONASE ) 50 MCG/ACT nasal spray Place 1 spray into both nostrils daily as needed for allergies. 01/29/21   [provider]  Melatonin 5 MG CAPS Take by mouth.    [provider]  omeprazole (PRILOSEC) 20  MG capsule Take 20 mg by mouth.    [provider]  ondansetron  (ZOFRAN ) 4 MG tablet Take 1 tablet (4 mg total) by mouth every 6 (six) hours as needed for nausea. 04/16/21   Kip Lynwood Double, PA-C  oxybutynin  (DITROPAN ) 5 MG tablet Take 5 mg by mouth daily.    [provider]  oxyCODONE  (OXY IR/ROXICODONE ) 5 MG immediate release tablet Take 1-2 tablets (5-10 mg total) by mouth every 4 (four) hours as needed for moderate pain (pain score 4-6). 04/16/21   Kip Lynwood Double, PA-C  pravastatin  (PRAVACHOL ) 10 MG tablet Take 10 mg by mouth at bedtime. 03/20/21   [provider]  traMADol  (ULTRAM ) 50 MG tablet Take 1 tablet (50 mg total) by mouth every 6 (six) hours as needed for moderate pain. 04/16/21   Kip Lynwood Double, PA-C  traZODone  (DESYREL ) 100 MG tablet Take 100 mg by mouth at bedtime. 03/28/21   [provider]    Family History Family History  Problem Relation Age of Onset   Breast cancer Maternal Aunt 22    Social History Social History[1]   Allergies   Gabapentin, Onion, Pregabalin, and Aspirin   Review of Systems Review of Systems  Constitutional:  Negative for fever.  Respiratory:  Positive for cough and shortness of breath.   All other systems reviewed and are negative.    Physical Exam Triage Vital Signs ED Triage Vitals  Encounter Vitals Group     BP 08/17/24 1043 (!) 140/81     Girls Systolic BP Percentile --      Girls Diastolic BP Percentile --      Boys Systolic BP Percentile --      Boys Diastolic BP Percentile --      Pulse Rate 08/17/24 1043 80     Resp 08/17/24 1043 16     Temp 08/17/24 1043 98.5 F (36.9 C)     Temp Source 08/17/24 1043 Oral     SpO2 08/17/24 1043 100 %     Weight --      Height --      Head Circumference --      Peak Flow --      Pain Score 08/17/24 1041 3     Pain Loc --      Pain Education --      Exclude from Growth Chart --    No data found.  Updated Vital Signs BP (!) 140/81 (BP  Location: Left Arm)   Pulse 80   Temp 98.5 F (36.9 C) (Oral)   Resp 16   SpO2 100%   Visual Acuity Right Eye Distance:   Left Eye Distance:   Bilateral Distance:    Right Eye Near:   Left Eye Near:    Bilateral Near:     Physical Exam  Vitals and nursing note reviewed.  Constitutional:      General: She is not in acute distress.    Appearance: She is well-developed and well-groomed. She is not ill-appearing or toxic-appearing.  HENT:     Head: Normocephalic.     Right Ear: Tympanic membrane is retracted.     Left Ear: Tympanic membrane is retracted.     Nose: Mucosal edema and congestion present.     Mouth/Throat:     Lips: Pink.     Mouth: Mucous membranes are moist.     Pharynx: Oropharynx is clear. Uvula midline.  Eyes:     General: Lids are normal.     Conjunctiva/sclera: Conjunctivae normal.     Pupils: Pupils are equal, round, and reactive to light.  Cardiovascular:     Rate and Rhythm: Normal rate and regular rhythm.     Heart sounds: Normal heart sounds.  Pulmonary:     Effort: Pulmonary effort is normal. No respiratory distress.     Breath sounds: Normal breath sounds and air entry. No decreased breath sounds or wheezing.     Comments: 02 sat 100% on RA, R 16 and unlabored Abdominal:     General: There is no distension.     Palpations: Abdomen is soft.  Musculoskeletal:        General: Normal range of motion.     Cervical back: Normal range of motion.  Skin:    General: Skin is warm and dry.     Findings: No rash.  Neurological:     General: No focal deficit present.     Mental Status: She is alert and oriented to person, place, and time.     GCS: GCS eye subscore is 4. GCS verbal subscore is 5. GCS motor subscore is 6.     Cranial Nerves: No cranial nerve deficit.     Sensory: No sensory deficit.  Psychiatric:        Speech: Speech normal.        Behavior: Behavior normal. Behavior is cooperative.      UC Treatments / Results  Labs (all labs  ordered are listed, but only abnormal results are displayed) Labs Reviewed - No data to display  EKG   Radiology No results found.  Procedures Procedures (including critical care time)  Medications Ordered in UC Medications - No data to display  Initial Impression / Assessment and Plan / UC Course  I have reviewed the triage vital signs and the nursing notes.  Pertinent labs & imaging results that were available during my care of the patient were reviewed by me and considered in my medical decision making (see chart for details).    Discussed exam findings and plan of care with patient, prescription of doxycycline to cover respiratory infections , Tessalon for cough during the day , Phenergan DM with drowsiness precautions scripted for evening use , strict go to ER precautions given.   Patient verbalized understanding to this provider.  Ddx: Acute respiratory infection, sinus drainage, cough, viral illness, allergies Final Clinical Impressions(s) / UC Diagnoses   Final diagnoses:  Bronchitis  Acute respiratory infection  Acute cough  Sinus drainage     Discharge Instructions      Take doxycycline as prescribed Take tessalon as prescribed while at work, phenergan dm in evening Drink plenty of fluids Follow up with PCP If you have new or worsening symptoms(chest pain, palpitations, worsening shortness of breath, etc.) go to the emergency room for further evaluation  ED Prescriptions     Medication Sig Dispense Auth. Provider   doxycycline (VIBRAMYCIN) 100 MG capsule Take 1 capsule (100 mg total) by mouth 2 (two) times daily for 7 days. 14 capsule Amayra Kiedrowski, NP   benzonatate (TESSALON) 100 MG capsule Take 1 capsule (100 mg total) by mouth every 8 (eight) hours. 21 capsule Kimberley Dastrup, NP   promethazine-dextromethorphan (PROMETHAZINE-DM) 6.25-15 MG/5ML syrup Take 5 mLs by mouth 4 (four) times daily as needed for cough. 118 mL Robbie Rideaux, NP       PDMP not reviewed this encounter.    [1]  Social History Tobacco Use   Smoking status: Former    Current packs/day: 0.00    Average packs/day: 1 pack/day for 10.0 years (10.0 ttl pk-yrs)    Types: Cigarettes    Start date: 49    Quit date: 1979    Years since quitting: 46.9   Smokeless tobacco: Never  Vaping Use   Vaping status: Never Used  Substance Use Topics   Drug use: Not Currently    Comment: not since college     Aminta Loose, NP 08/17/24 1117
